# Patient Record
Sex: Male | Born: 1955 | Race: Black or African American | Hispanic: No
Health system: Southern US, Community
[De-identification: ages and names within clinical notes are randomized; demographics above are authoritative.]

---

## 2021-11-20 ENCOUNTER — Emergency Department (HOSPITAL_COMMUNITY): Payer: Medicare Other

## 2021-11-20 ENCOUNTER — Encounter (HOSPITAL_COMMUNITY): Payer: Self-pay | Admitting: Internal Medicine

## 2021-11-20 ENCOUNTER — Inpatient Hospital Stay (HOSPITAL_COMMUNITY)
Admission: EM | Admit: 2021-11-20 | Discharge: 2021-11-23 | DRG: 565 | Disposition: A | Discharge status: Skilled Nursing Facility | Payer: Medicare Other | Attending: Internal Medicine | Admitting: Internal Medicine

## 2021-11-20 ENCOUNTER — Other Ambulatory Visit: Payer: Self-pay

## 2021-11-20 DIAGNOSIS — N1831 Chronic kidney disease, stage 3a: Secondary | ICD-10-CM | POA: Diagnosis present

## 2021-11-20 DIAGNOSIS — T796XXA Traumatic ischemia of muscle, initial encounter: Principal | ICD-10-CM | POA: Diagnosis present

## 2021-11-20 DIAGNOSIS — F10129 Alcohol abuse with intoxication, unspecified: Secondary | ICD-10-CM

## 2021-11-20 DIAGNOSIS — N179 Acute kidney failure, unspecified: Secondary | ICD-10-CM | POA: Diagnosis present

## 2021-11-20 DIAGNOSIS — Z7982 Long term (current) use of aspirin: Secondary | ICD-10-CM

## 2021-11-20 DIAGNOSIS — F10230 Alcohol dependence with withdrawal, uncomplicated: Secondary | ICD-10-CM | POA: Diagnosis not present

## 2021-11-20 DIAGNOSIS — I672 Cerebral atherosclerosis: Secondary | ICD-10-CM | POA: Diagnosis present

## 2021-11-20 DIAGNOSIS — F0390 Unspecified dementia without behavioral disturbance: Secondary | ICD-10-CM | POA: Diagnosis present

## 2021-11-20 DIAGNOSIS — I129 Hypertensive chronic kidney disease with stage 1 through stage 4 chronic kidney disease, or unspecified chronic kidney disease: Secondary | ICD-10-CM | POA: Diagnosis present

## 2021-11-20 DIAGNOSIS — R350 Frequency of micturition: Secondary | ICD-10-CM | POA: Diagnosis present

## 2021-11-20 DIAGNOSIS — E861 Hypovolemia: Secondary | ICD-10-CM | POA: Diagnosis present

## 2021-11-20 DIAGNOSIS — E872 Acidosis, unspecified: Secondary | ICD-10-CM | POA: Diagnosis present

## 2021-11-20 DIAGNOSIS — R29810 Facial weakness: Secondary | ICD-10-CM | POA: Diagnosis present

## 2021-11-20 DIAGNOSIS — F10229 Alcohol dependence with intoxication, unspecified: Secondary | ICD-10-CM | POA: Diagnosis present

## 2021-11-20 DIAGNOSIS — S12101A Unspecified nondisplaced fracture of second cervical vertebra, initial encounter for closed fracture: Secondary | ICD-10-CM

## 2021-11-20 DIAGNOSIS — I4891 Unspecified atrial fibrillation: Secondary | ICD-10-CM | POA: Diagnosis not present

## 2021-11-20 DIAGNOSIS — F1410 Cocaine abuse, uncomplicated: Secondary | ICD-10-CM | POA: Diagnosis not present

## 2021-11-20 DIAGNOSIS — R471 Dysarthria and anarthria: Secondary | ICD-10-CM | POA: Diagnosis present

## 2021-11-20 DIAGNOSIS — Z20822 Contact with and (suspected) exposure to covid-19: Secondary | ICD-10-CM | POA: Diagnosis present

## 2021-11-20 DIAGNOSIS — Z79899 Other long term (current) drug therapy: Secondary | ICD-10-CM

## 2021-11-20 DIAGNOSIS — R55 Syncope and collapse: Secondary | ICD-10-CM | POA: Diagnosis not present

## 2021-11-20 DIAGNOSIS — R7303 Prediabetes: Secondary | ICD-10-CM | POA: Diagnosis present

## 2021-11-20 DIAGNOSIS — G40909 Epilepsy, unspecified, not intractable, without status epilepticus: Secondary | ICD-10-CM | POA: Diagnosis present

## 2021-11-20 DIAGNOSIS — E871 Hypo-osmolality and hyponatremia: Secondary | ICD-10-CM | POA: Diagnosis present

## 2021-11-20 DIAGNOSIS — Y906 Blood alcohol level of 120-199 mg/100 ml: Secondary | ICD-10-CM | POA: Diagnosis present

## 2021-11-20 DIAGNOSIS — N401 Enlarged prostate with lower urinary tract symptoms: Secondary | ICD-10-CM | POA: Diagnosis present

## 2021-11-20 LAB — DIFFERENTIAL
Abs Immature Granulocytes: 0.02 10*3/uL (ref 0.00–0.07)
Basophils Absolute: 0 10*3/uL (ref 0.0–0.1)
Basophils Relative: 1 %
Eosinophils Absolute: 0 10*3/uL (ref 0.0–0.5)
Eosinophils Relative: 0 %
Immature Granulocytes: 0 %
Lymphocytes Relative: 37 %
Lymphs Abs: 2 10*3/uL (ref 0.7–4.0)
Monocytes Absolute: 0.6 10*3/uL (ref 0.1–1.0)
Monocytes Relative: 11 %
Neutro Abs: 2.8 10*3/uL (ref 1.7–7.7)
Neutrophils Relative %: 51 %

## 2021-11-20 LAB — URINALYSIS, ROUTINE W REFLEX MICROSCOPIC
Bacteria, UA: NONE SEEN
Bilirubin Urine: NEGATIVE
Glucose, UA: NEGATIVE mg/dL
Ketones, ur: NEGATIVE mg/dL
Nitrite: NEGATIVE
Protein, ur: NEGATIVE mg/dL
Specific Gravity, Urine: 1.02 (ref 1.005–1.030)
pH: 5 (ref 5.0–8.0)

## 2021-11-20 LAB — COMPREHENSIVE METABOLIC PANEL
ALT: 37 U/L (ref 0–44)
AST: 83 U/L — ABNORMAL HIGH (ref 15–41)
Albumin: 3.7 g/dL (ref 3.5–5.0)
Alkaline Phosphatase: 46 U/L (ref 38–126)
Anion gap: 11 (ref 5–15)
BUN: 19 mg/dL (ref 8–23)
CO2: 18 mmol/L — ABNORMAL LOW (ref 22–32)
Calcium: 8.6 mg/dL — ABNORMAL LOW (ref 8.9–10.3)
Chloride: 101 mmol/L (ref 98–111)
Creatinine, Ser: 2.2 mg/dL — ABNORMAL HIGH (ref 0.61–1.24)
GFR, Estimated: 32 mL/min — ABNORMAL LOW (ref >60)
Glucose, Bld: 95 mg/dL (ref 70–99)
Potassium: 4 mmol/L (ref 3.5–5.1)
Sodium: 130 mmol/L — ABNORMAL LOW (ref 135–145)
Total Bilirubin: 0.8 mg/dL (ref 0.3–1.2)
Total Protein: 6.8 g/dL (ref 6.5–8.1)

## 2021-11-20 LAB — BETA-HYDROXYBUTYRIC ACID: Beta-Hydroxybutyric Acid: 0.33 mmol/L — ABNORMAL HIGH (ref 0.05–0.27)

## 2021-11-20 LAB — RESP PANEL BY RT-PCR (FLU A&B, COVID) ARPGX2
Influenza A by PCR: NEGATIVE
Influenza B by PCR: NEGATIVE
SARS Coronavirus 2 by RT PCR: NEGATIVE

## 2021-11-20 LAB — RAPID URINE DRUG SCREEN, HOSP PERFORMED
Amphetamines: NOT DETECTED
Barbiturates: NOT DETECTED
Benzodiazepines: NOT DETECTED
Cocaine: POSITIVE — AB
Opiates: NOT DETECTED
Tetrahydrocannabinol: NOT DETECTED

## 2021-11-20 LAB — CBC
HCT: 33.9 % — ABNORMAL LOW (ref 39.0–52.0)
Hemoglobin: 11.5 g/dL — ABNORMAL LOW (ref 13.0–17.0)
MCH: 32.6 pg (ref 26.0–34.0)
MCHC: 33.9 g/dL (ref 30.0–36.0)
MCV: 96 fL (ref 80.0–100.0)
Platelets: 201 10*3/uL (ref 150–400)
RBC: 3.53 MIL/uL — ABNORMAL LOW (ref 4.22–5.81)
RDW: 13.8 % (ref 11.5–15.5)
WBC: 5.5 10*3/uL (ref 4.0–10.5)
nRBC: 0 % (ref 0.0–0.2)

## 2021-11-20 LAB — APTT: aPTT: 33 seconds (ref 24–36)

## 2021-11-20 LAB — I-STAT CHEM 8, ED
BUN: 22 mg/dL (ref 8–23)
Calcium, Ion: 1.1 mmol/L — ABNORMAL LOW (ref 1.15–1.40)
Chloride: 100 mmol/L (ref 98–111)
Creatinine, Ser: 2.6 mg/dL — ABNORMAL HIGH (ref 0.61–1.24)
Glucose, Bld: 92 mg/dL (ref 70–99)
HCT: 37 % — ABNORMAL LOW (ref 39.0–52.0)
Hemoglobin: 12.6 g/dL — ABNORMAL LOW (ref 13.0–17.0)
Potassium: 4.1 mmol/L (ref 3.5–5.1)
Sodium: 131 mmol/L — ABNORMAL LOW (ref 135–145)
TCO2: 20 mmol/L — ABNORMAL LOW (ref 22–32)

## 2021-11-20 LAB — CK: Total CK: 1915 U/L — ABNORMAL HIGH (ref 49–397)

## 2021-11-20 LAB — PROTIME-INR
INR: 1.1 (ref 0.8–1.2)
Prothrombin Time: 13.7 seconds (ref 11.4–15.2)

## 2021-11-20 LAB — CBG MONITORING, ED: Glucose-Capillary: 111 mg/dL — ABNORMAL HIGH (ref 70–99)

## 2021-11-20 LAB — HEMOGLOBIN A1C
Hgb A1c MFr Bld: 6.1 % — ABNORMAL HIGH (ref 4.8–5.6)
Mean Plasma Glucose: 128.37 mg/dL

## 2021-11-20 LAB — HIV ANTIBODY (ROUTINE TESTING W REFLEX): HIV Screen 4th Generation wRfx: NONREACTIVE

## 2021-11-20 LAB — MAGNESIUM: Magnesium: 1.9 mg/dL (ref 1.7–2.4)

## 2021-11-20 LAB — ETHANOL: Alcohol, Ethyl (B): 155 mg/dL — ABNORMAL HIGH (ref <10)

## 2021-11-20 LAB — PHOSPHORUS: Phosphorus: 3.5 mg/dL (ref 2.5–4.6)

## 2021-11-20 MED ORDER — LORAZEPAM 2 MG/ML IJ SOLN: 1.0000 mg | INTRAMUSCULAR | Status: AC | PRN

## 2021-11-20 MED ORDER — HYDROCODONE-ACETAMINOPHEN 5-325 MG PO TABS
1.0000 | ORAL_TABLET | ORAL | Status: DC | PRN
  Administered 2021-11-20 – 2021-11-21 (×3): 1 via ORAL
  Administered 2021-11-21: 2 via ORAL
  Administered 2021-11-21: 1 via ORAL
  Administered 2021-11-21 – 2021-11-22 (×4): 2 via ORAL
  Administered 2021-11-22: 1 via ORAL
  Administered 2021-11-22 – 2021-11-23 (×5): 2 via ORAL
  Filled 2021-11-20 (×3): qty 2
  Filled 2021-11-20 (×2): qty 1
  Filled 2021-11-20: qty 2
  Filled 2021-11-20: qty 1
  Filled 2021-11-20: qty 2
  Filled 2021-11-20: qty 1
  Filled 2021-11-20 (×3): qty 2
  Filled 2021-11-20: qty 1
  Filled 2021-11-20 (×2): qty 2

## 2021-11-20 MED ORDER — SENNOSIDES-DOCUSATE SODIUM 8.6-50 MG PO TABS: 1.0000 | ORAL_TABLET | Freq: Every evening | ORAL | Status: DC | PRN

## 2021-11-20 MED ORDER — FOLIC ACID 1 MG PO TABS
1.0000 mg | ORAL_TABLET | Freq: Every day | ORAL | Status: DC
  Administered 2021-11-20 – 2021-11-23 (×4): 1 mg via ORAL
  Filled 2021-11-20 (×4): qty 1

## 2021-11-20 MED ORDER — ACETAMINOPHEN 650 MG RE SUPP: 650.0000 mg | Freq: Four times a day (QID) | RECTAL | Status: DC | PRN

## 2021-11-20 MED ORDER — LORAZEPAM 1 MG PO TABS
1.0000 mg | ORAL_TABLET | ORAL | Status: AC | PRN
  Administered 2021-11-21 – 2021-11-22 (×2): 1 mg via ORAL
  Filled 2021-11-20 (×3): qty 1

## 2021-11-20 MED ORDER — ACETAMINOPHEN 325 MG PO TABS: 650.0000 mg | ORAL_TABLET | Freq: Four times a day (QID) | ORAL | Status: DC | PRN

## 2021-11-20 MED ORDER — ENOXAPARIN SODIUM 30 MG/0.3ML IJ SOSY
30.0000 mg | PREFILLED_SYRINGE | INTRAMUSCULAR | Status: DC
  Administered 2021-11-20 – 2021-11-21 (×2): 30 mg via SUBCUTANEOUS
  Filled 2021-11-20 (×2): qty 0.3

## 2021-11-20 MED ORDER — LACTATED RINGERS IV SOLN: INTRAVENOUS | Status: AC

## 2021-11-20 MED ORDER — ADULT MULTIVITAMIN W/MINERALS CH
1.0000 | ORAL_TABLET | Freq: Every day | ORAL | Status: DC
  Administered 2021-11-20 – 2021-11-23 (×4): 1 via ORAL
  Filled 2021-11-20 (×4): qty 1

## 2021-11-20 MED ORDER — LACTATED RINGERS IV BOLUS
1000.0000 mL | Freq: Once | INTRAVENOUS | Status: AC
  Administered 2021-11-20: 1000 mL via INTRAVENOUS

## 2021-11-20 MED ORDER — IOHEXOL 350 MG/ML SOLN
75.0000 mL | Freq: Once | INTRAVENOUS | Status: AC | PRN
  Administered 2021-11-20: 75 mL via INTRAVENOUS

## 2021-11-20 MED ORDER — THIAMINE HCL 100 MG PO TABS
100.0000 mg | ORAL_TABLET | Freq: Every day | ORAL | Status: DC
  Administered 2021-11-20 – 2021-11-23 (×4): 100 mg via ORAL
  Filled 2021-11-20 (×4): qty 1

## 2021-11-20 MED ORDER — ASPIRIN 300 MG RE SUPP
300.0000 mg | Freq: Once | RECTAL | Status: AC
Start: 2021-11-20 — End: 2021-11-20
  Administered 2021-11-20: 300 mg via RECTAL
  Filled 2021-11-20: qty 1

## 2021-11-20 NOTE — ED Triage Notes (Signed)
Pt arrives via EMS from the store as code stroke with LSN 11. Pt went to the store and had a fall at the store. Pt was helped up and went to buy his beer. Pt was on bench slumped over when EMS arrived. EMS noted left side weakness, slurred speech and facial droop. C-collar in place.  ?Pt states he takes meds for dizziness. Denies blood thinners but also unsure what medications he takes.  ?

## 2021-11-20 NOTE — Hospital Course (Addendum)
Mr. Jake Powell is a 66 year old male with a past medical history significant for hypertension and dizziness.  ? ? ?Patient states that he was walking to the store, which she does every day without any issues.  As he was walking he began feeling lightheaded and he sat down.  His lightheadedness persisted despite sitting down.  He noted no prodromal symptoms including no palpitations, diaphoresis, feelings of warmness or coolness, nausea, or changes in his vision prior to experiencing a syncopal event. The event was witnessed. Patient had no confusion after his fall, no tongue biting, no loss of bowel or bladder function. He also denies orthostasis.  ? ?He reports that he first started having "black out spells" as a child, he does not remember when exactly they started but notes they last happened in his youth when he was 66 years old. He states they stopped happening for several years.   ? ?However approximately 1 to 2 years ago he began having these black out spells again.  He states during this time.  He had approximately 4 episodes.  The most recent episode was September 2022 during which he was hospitalized at [facility].  He states that episode was similar to his current presentation.   ? ?He does note that he has been on meclizine for these issues but does not recall having a specific diagnosis.  He has also been told that he had a seizure.  The patient reports he was never started on any antiepileptic medications.   ? ?Of note, patient reports that he drinks approximately 2-3 beers nightly and used to be a heavy hard liquor drinker from his teens up until 25 years ago. He also stt liquor past two years supposed to be taking medicine for dizzy spells. He also states that he takes benadryl daily for itching, though he is not clear as to what causes his itching.  ? ?Lastly patient reports urinary frequency over the past several years. He has spoken to doctors about this but has not been started on any  medications.  ? ?PMH:  ?Does not follow regularly with a PCP ?He last saw a Dr. April in [place] ~ 2 months ago. ?Most of his medications were written when he was hospitalized per patient.  ?He does not recall his medical diagnoses or his medications.  ?Listed in the chart ?Amlodipine 5 mg daily ?Lisinopril 10 mg daily ?BuSpar 5 mg twice daily ?Pepcid 20 mg daily ?Propranolol 20 mg nightly ?Thiamine 100 mg daily ?Zanaflex 4 mg every 6 hours as needed ?States he used to be on a blood thinner but it was stopped years ago, he is not certain why this was started ? ?PSH:  ?None  ? ?Allergies:  ?NKDA  ? ?FH: Mom died of natural causes, no known health problems  ?Dad deceased when patient was a child ? ?SH:  ?Never smoker ?Started drinking 50 years ago drinking liquour "all day and night." 25 y ago quit hard alcohol. Drinks 2-3 beers a day. Soemtimes none. His last drink was the night prior to the day of admission.  ?He does report cocaine use, he most recently used this 2days ago.  ?Patient is from [place] and moved to [place] to be with his spouse he passed away ~1 year ago. He currently lives alone, he is able to perform his ADLs/IADLs without difficulty.  ? ?Ran out of these pills about a month ago.  ? ?Pills for dizziness [facility] has all of records.  ? ?  Dr. April [place]  saw her 2 months ago.  ? ? ?You were walking to the store. Walks there everyday with no problem.  ? ?Sat down and started feeling dizzy. Lightheaded. Sditting down and walking.  ? ? ?Bystander saw patient fall. No palpitations. Sometimes get dizzy standing.  ? ? ?Used to have spells wwhen a kid where he would pass out.  ? ? ?Never diagnosed with anything.  ? ?A year or two years ago. Black out spells four times.  ? ?One doctor said it was seizure.  ? ? This was in 03/2021 [facility].  ? ?No peeeing or soiling, no bitingj tongue. Told fell on side.  ? ?N iochest pain.No shortness of breath. NO changes in vison.  ? ?Walks to the store  without any difficulty. Used to use a cane.  ?Didn't eat anything this morning. Usually eat something. Yesterday drinking alcohol drink 2-3 beer a day.  ? ?Bladder weka? Have to pee 5-6 times a night.  ? ? ?PMH: ?HTN ?Take benadryl mostly everyday. Ran out 3 d ago. Take asprin eeryday started 6 months ago.  ?No MI o CVA  ?BPH  ? ? ?PSH:  ? ?None ? ?Allergies: Sesonal allergies NKDA ? ?ON a blood thinner years ago. Told blood too thick years ? ?FH: Mom died of natural causes, no known health problems  ?Dad deceased when patient was a child ? ?SH:  ?Never smoker ?Started drinking 50 years ago drinking liquour "all day and night." 25 y ago quit hard alcohol. Drinks 2-3 beers a day. Soemtimes none. His last drink was the night prior to the day of admission.  ?He does report cocaine use, he most recently used this 2days ago.  ?Patient is from [place] and moved to [place] to be with his spouse he passed away ~1 year ago. He currently lives alone, he is able to perform his ADLs/IADLs without difficulty.  ? ?Last drink was yesterday evening.  ? ? ? ?Cocaine in the past, 2 days ago did cocaine.  ? ? ?Lately drink a lot of water. Cup in the morning  ? ? ? ?

## 2021-11-20 NOTE — H&P (Addendum)
? ? ? ?Date: 11/20/2021     ?     ?     ?Patient Name:  Jake Powell MRN: [id number]  ?DOB: 31-Mar-1956 Age / Sex: 66 y.o., male   ?PCP: Pcp, No    ?     ?Medical Service: Internal Medicine Teaching Service    ?     ?Attending Physician: Dr. Heber [place], Rachel Moulds, DO    ?First Contact: Dr. Emeline General, MD  Pager: [phone]  ?Second Contact: Dr. Eulas Post, MD  Pager: [phone]  ?     ?After Hours (After 5p/  First Contact Pager: [phone]  ?weekends / holidays): Second Contact Pager: [phone]  ? ?Chief Complaint: Syncope ? ?History of Present Illness:  ? ?Jake Powell is a 66 year old male with a past medical history significant for hypertension and dizziness.  ? ? ?Patient states that he was walking to the store, which she does every day without any issues.  As he was walking he began feeling lightheaded and he sat down.  His lightheadedness persisted despite sitting down.  He noted no prodromal symptoms including no palpitations, diaphoresis, feelings of warmness or coolness, nausea, or changes in his vision prior to experiencing a syncopal event. The event was witnessed. Patient had no confusion after his fall, no tongue biting, no loss of bowel or bladder function. He also denies orthostasis.  ? ?He reports that he first started having "black out spells" as a child, he does not remember when exactly they started but notes they last happened in his youth when he was 66 years old. He states they stopped happening for several years.   ? ?However approximately 1 to 2 years ago he began having these black out spells again.  He states during this time.  He had approximately 4 episodes.  The most recent episode was September 2022 during which he was hospitalized at [facility].  He states that episode was similar to his current presentation.   ? ?He does note that he has been on meclizine for these issues but does not recall having a specific diagnosis.  He has also been told that he had a seizure.  The patient reports he was  never started on any antiepileptic medications.   ? ?Of note, patient reports that he drinks approximately 2-3 beers nightly and used to be a heavy hard liquor drinker from his teens up until 25 years ago. He also stt liquor past two years supposed to be taking medicine for dizzy spells. He also states that he takes benadryl daily for itching, though he is not clear as to what causes his itching.  ? ?Lastly patient reports urinary frequency over the past several years. He has spoken to doctors about this but has not been started on any medications.  ? ?PMH:  ?Does not follow regularly with a PCP ?He last saw a Dr. April in [place] ~ 2 months ago. ?Most of his medications were written when he was hospitalized per patient.  ?He does not recall his medical diagnoses or his medications.  ?Listed in the chart ?Amlodipine 5 mg daily ?Lisinopril 10 mg daily ?BuSpar 5 mg twice daily ?Pepcid 20 mg daily ?Propranolol 20 mg nightly ?Thiamine 100 mg daily ?Zanaflex 4 mg every 6 hours as needed ?ASA 81mg  qd (unclear reason why) ?States he used to be on a blood thinner but it was stopped years ago, he is not certain why this was started ?Ran out of all of his pills a month  ago   ? ?PSH:  ?None  ? ?Allergies:  ?NKDA  ? ?FH: Mom died of natural causes, no known health problems  ?Dad deceased when patient was a child ? ?SH:  ?Never smoker ?Started drinking 50 years ago drinking liquour "all day and night." 25 y ago quit hard alcohol. Drinks 2-3 beers a day. Soemtimes none. His last drink was the night prior to the day of admission.  ?He does report cocaine use, he most recently used this 2days ago.  ?Patient is from [place] and moved to [place] to be with his spouse he passed away ~1 year ago. He currently lives alone, he is able to perform his ADLs/IADLs without difficulty.  ? ?Meds:  ?He has not had any of his medications  ?Current Meds  ?Medication Sig  ? acetaminophen (TYLENOL) 325 MG tablet Take 650 mg by mouth every 6 (six) hours as  needed for pain.  ? amLODipine (NORVASC) 5 MG tablet Take 1 tablet by mouth daily.  ? aspirin 81 MG EC tablet Take 1 tablet by mouth daily.  ? busPIRone (BUSPAR) 5 MG tablet Take 5 mg by mouth 2 (two) times daily as needed for anxiety.  ? famotidine (PEPCID) 20 MG tablet Take 1 tablet by mouth daily.  ? lisinopril (ZESTRIL) 10 MG tablet Take 10 mg by mouth daily.  ? Meclizine HCl 25 MG CHEW Chew 1 tablet by mouth daily as needed.  ? Multiple Vitamin (QUINTABS) TABS Take 1 tablet by mouth daily.  ? omeprazole (PRILOSEC OTC) 20 MG tablet Take 20 mg by mouth daily.  ? propranolol (INDERAL) 20 MG tablet Take 20 mg by mouth in the morning and at bedtime.  ? thiamine 100 MG tablet Take 100 mg by mouth daily.  ? tiZANidine (ZANAFLEX) 4 MG tablet Take 4 mg by mouth every 6 (six) hours as needed.  ? ? ? ?Allergies: ?Allergies as of 11/20/2021  ? (Not on File)  ? ? ? ?Review of Systems: ?A complete ROS was negative except as per HPI.  ? ?Physical Exam: ?Blood pressure 126/79, pulse 99, temperature 98.4 ?F (36.9 ?C), temperature source Oral, resp. rate 14, height 6\' 2"  (1.88 m), weight 84.6 kg, SpO2 96 %. ? ?Constitutional: Well-developed, well-nourished, disheveled, and in no distress.  ?HENT:  ?Head: Normocephalic and atraumatic.  ?Eyes: EOM are normal.  ?Neck: In [place] j collar  ?Cardiovascular: Normal rate, regular rhythm, intact distal pulses. No gallop and no friction rub.  ?No murmur heard. No lower extremity edema  ?Pulmonary: Non labored breathing on room air, no wheezing or rales  ?Abdominal: Soft. Normal bowel sounds. Non distended and non tender ?Musculoskeletal: Normal range of motion.     ?   General: No tenderness or edema.  ?Neurological: Alert and oriented to person, place, and time. Non focal  ?Skin: Skin is warm and dry.  ? ?EKG: personally reviewed my interpretation is NSR ? ?CT Cervical Spine ?FINDINGS: ?Alignment: Normal. There is an acute fracture deformity involving C2 ?vertebral body. Fracture line  extends from the right lateral base of ?the dens and extends vertically into the C2 vertebral body, image ?21/8. A second fracture is identified through the left posterior ?elements of the C2 vertebral body, image 25/11. The fracture ?fragments appear minimally displaced. ?  ?Skull base and vertebrae: No additional fracture identified. ?  ?Soft tissues and spinal canal: No prevertebral fluid or swelling. No ?visible canal hematoma. ?  ?Disc levels: Disc space narrowing and endplate spurring is ?identified at the  C5-6 level. ?  ?Upper chest: Negative. ?  ?Other: None ?  ?IMPRESSION: ?1. Acute fracture deformity involving the C2 vertebral body. ?Fracture line extends from the right lateral base of the dens and ?extends vertically into the C2 vertebral body. ?2. A second fracture is identified through the left posterior ?elements of C2 vertebral body. ?3. Cervical spondylosis. ? ?CTA Head and neck  ?FINDINGS: ?CTA NECK FINDINGS ?  ?Aortic arch: The imaged aortic arch is normal. The origins of the ?major branch vessels are patent. The subclavian arteries are patent ?to the level imaged. ?  ?Right carotid system: Right common, internal, and external carotid ?arteries are patent, without hemodynamically significant stenosis or ?occlusion. There is no evidence of dissection. There is mild ?fusiform dilation of the distal internal carotid artery in the neck ?measuring up to 7 mm in the coronal plane (compared to a more ?proximal diameter of approximately 5 mm). ?  ?Left carotid system: The left common, internal, and external carotid ?arteries are patent, without hemodynamically significant stenosis or ?occlusion. There is no dissection or aneurysm. ?  ?Vertebral arteries: The vertebral arteries are patent, without ?hemodynamically significant stenosis or occlusion. There is no ?dissection or aneurysm. ?  ?Skeleton: There is an acute fracture of the C2 vertebral body with ?an oblique fracture plane extending from the right  aspect of the ?base of the dens inferiorly through the vertebral body. There is ?extension into the left lamina and inferior articulating process ?(7-177, 9-123). There is no other acute fracture. There i

## 2021-11-20 NOTE — Consult Note (Addendum)
Neurology Attending Attestation ?  ?I examined the patient and discussed plan with resident Dr. Leonides Cave. I agree with the note below with the following additions/exceptions: ? ?This is a 66 year old gentleman with a past medical history significant for alcohol use disorder, questionable seizure history, he was found outside on a bench slumped over on his left side after he walked to the store at 11 AM to purchase alcohol.  On arrival he had a questionable left facial droop, was able to name 5 out of 6 objects, and had mild dysarthria giving him a stroke scale of 3. Head CT NAICP personal review. TNK not administered 2/2 mild sx and low suspicion for stroke. Stroke code canceled. ? ?CT c spine showed C2 fracture. CTA H&N showed no hemodynamically significant stenosis  and a fusiform filation of the high cervical R ICA measuring up to 43mm. Utox (+) for cocaine. EtOH level 155. Labs c/w rhabdomyolysis with CK 1915, creatinine 2.60, urine dipstick (+) for Hgb. ? ?Patient presented with nonfocal sx except ?L facial droop in the setting of cocaine and EtOH intoxication, rhabdomyolysis, and fall vs syncope resulting in C2 fracture. Suspicion for stroke is low and no further stroke workup is recommended at this time. Patient is unsure if he has had seizures in the past, and was not on AED PTA, but is at risk for withdrawal seizures. Recommend CIWA, thiamine supplementation, and low threshold to reconsult neurology for seizure-like activity or further deterioration in mental status. Otherwise neurology will be available for questions prn going forward. ? ?I was present throughout the stroke code and made all significant decisions and personally reviewed CNS imaging ?  ?Su Monks, MD ?[facility] ?[phone] ?  ?If 7pm- 7am, please page neurology on call as listed in [place]. ? ? ?NEUROLOGY CONSULTATION NOTE  ? ?Date of service: Nov 20, 2021 ?Patient Name: Jake Powell ?MRN:  [id number] ?DOB:  11/24/1955 ?Reason  for consult: "code stroke" ?Requesting physician: EDP ?_ _ _   _ __   _ __ _ _  __ __   _ __   __ _ ? ?History of Present Illness  ? ?Jake Powell is a 66 y.o. male with PMH significant for ETOH use disorder, seizure disorder, who presents with  left-sided weakness and decreased sensation.  ? ?Patient last known normal this morning at 11 am when walking to the store to purchase ETOH. The patient was subsequently found outside in the grass slumped over on his left side. EMS was called and states that he had left upper and lower extremity weakness and anesthesia with associated dysarthria. Unknown last drink but typically drink 2x 40oz beers daily. Endorses a history of ETOH related seizures and not on AED. States that he was previously on [facility] but denies current [facility] and does not remember the names of his medications.  ? ?Stroke workup this admission: ? ?CTH: no obvious intercranial hemorrhage or subacute infarct. Significant left MCA calcification noted.  ? ?CTA/MRA: pending  ? ?Lipid Panel: No results found for: [place] ?HgbA1c: No results found for: HGBA1C ? ?  ?ROS  ? ?Per HPI; all other systems reviewed and are negative ? ?Past History  ? ?No past medical history on file. ? ?No family history on file. ?Social History  ? ?Socioeconomic History  ? Marital status: Not on file  ?  Spouse name: Not on file  ? Number of children: Not on file  ? Years of education: Not on file  ? Highest education level: Not  on file  ?Occupational History  ? Not on file  ?Tobacco Use  ? Smoking status: Not on file  ? Smokeless tobacco: Not on file  ?Substance and Sexual Activity  ? Alcohol use: Not on file  ? Drug use: Not on file  ? Sexual activity: Not on file  ?Other Topics Concern  ? Not on file  ?Social History Narrative  ? Not on file  ? ?Social Determinants of Health  ? ?Financial Resource Strain: Not on file  ?Food Insecurity: Not on file  ?Transportation Needs: Not on file  ?Physical Activity: Not on file  ?Stress: Not on file   ?Social Connections: Not on file  ? ?Not on File ? ?Medications  ? ?(Not in a hospital admission) ?  ? ?Vitals  ? ?Vitals:  ? 11/20/21 1200  ?Weight: 84.6 kg  ?  ? ?There is no height or weight on file to calculate BMI. ? ?Physical Exam  ? ?Physical Exam ?Gen: A&O x4, NAD ?Resp: CTAB ?CV: RRR ?Extrem: Nml bulk; no cyanosis, clubbing, or edema. ? ?Neuro: ?*MS: A&O x4. Follows multi-step commands.  ?*Speech: dysarthric and mild aphasia ?*CN:  ?  I: Deferred ?  II,III: PERRLA, VFF by confrontation, optic discs not visualized 2/2 pupillary construction ?  III,IV,VI: EOMI w/o nystagmus, no ptosis ?  V: Sensation intact from V1 to V3 to LT ?  VII: Eyelid closure was full. left facial droop. ?  VIII: Hearing intact to voice ?  IX,X: Voice normal, palate elevates symmetrically  ?  XI: SCM/trap 5/5 bilat   ?XII: Tongue protrudes midline, no atrophy or fasciculations  ? ?*Motor:   Normal bulk.  No tremor, rigidity or bradykinesia. No pronator drift. ?Able to lift upper and lower extremities against gravity without drift.  ? ?*Sensory: Intact to light touch, pinprick, temperature vibration throughout. Symmetric. Propioception intact bilat.  No double-simultaneous extinction.  ?*Coordination:  Finger-to-nose intact and heel to shine normal ?*Reflexes:  ?*Gait: ? ?NIHSS ? ?1a Level of Conscious.: 0 ?1b LOC Questions: 0 ?1c LOC Commands: 0 ?2 Best Gaze: 0 ?3 Visual: 0 ?4 Facial Palsy: 1 (left facial droop) ?5a Motor Arm - left: 0 ?5b Motor Arm - Right: 0 ?6a Motor Leg - Left: 0 ?6b Motor Leg - Right: 0 ?7 Limb Ataxia: 0 ?8 Sensory: 0 ?9 Best Language: 1 (aphasia) ?10 Dysarthria: 1 ?11 Extinct. and Inatten.: 0 ? ?TOTAL: 3 ? ? ?Premorbid mRS = 2+ ? ? ?Labs  ? ? ?CBC:  ?Recent Labs  ?Lab 11/20/21 ?1210  ?HGB 12.6*  ?HCT 37.0*  ? ? ?Basic Metabolic Panel:  ?Lab Results  ?Component Value Date  ? NA 131 (L) 11/20/2021  ? K 4.1 11/20/2021  ? GLUCOSE 92 11/20/2021  ? BUN 22 11/20/2021  ? CREATININE 2.60 (H) 11/20/2021  ? ? ?Urine Drug  Screen: No results found for: LABOPIA, COCAINSCRNUR, [place], [place], THCU, LABBARB  ?Alcohol Level No results found for: [place] ? ?CT Head without contrast: ?No obvious intercranial hemorrhage or subacute infarct. Significant left MCA calcification noted.  ? ?CT angio Head and Neck with contrast: pending ? ?rEEG: pending ? ?Impression  ? ?Cerebral Infarct: ?Patient presents with acute onset focal neurologic deficits. Code stoke was called. Patient was evaluated and found to have NIHSS of 3 and mR score of 2. Deemed not a candidate for TNK due to rapidly improving symptoms. CTA pending due to significant MCA calcification noted on CT head. Additionally patient has a history of ETOH withdrawal seizure and seizure  disorder and will need EEG once admitted. ? ?ETOH use disorder:  ?Patient has a history of ETOH used disorder and is high risk for associated withdrawal seizures. Patient does endorse an underlying seizure disorder but it is unclear if this is only associated with his ETOH use. Will need IP admission for withdrawal monitoring and EEG  ? ?Seizure Disorder: ?Unclear if this is associated with ETOH use or other substance use. Denies AED use.  ? ?Recommendations  ? ?See attending attestation ? ?______________________________________________________________________ ? ? ?Thank you for the opportunity to take part in the care of this patient. If you have any further questions, please contact the neurology consultation attending. ? ?Signed, ? ?Lawerance Cruel, D.O.  ?Internal Medicine Resident, PGY-3 ?[facility]  ?12:41 PM, 11/20/2021  ? ?

## 2021-11-20 NOTE — ED Notes (Signed)
Pt placed in aspen collar °

## 2021-11-20 NOTE — Code Documentation (Signed)
Stroke Response Nurse Documentation ?Code Documentation ? ?Jake Powell is a 66 y.o. male arriving to [facility]  via [facility] on 11/20/21 with past medical hx of ETOH. On No antithrombotic. Code stroke was activated by EMS.  ? ?Patient was LKW at 1100 when he was buying beer at the store. Patient fell and was helped up, bought beer and then was found outside slumped on the bench. EMS arrived and noted left facial droop, left weakness and slurred speech.  C-collar placed. ? ?Stroke team at the bedside on patient arrival. Labs drawn and patient cleared for CT by EDP.  Patient to CT with team. NIHSS 3, see documentation for details and code stroke times. Patient with left facial droop, Expressive aphasia , and dysarthria  on exam.  ? ?The following imaging was completed:  CT Head and CTA. Patient is not a candidate for IV Thrombolytic due to symptoms mild, improving. Patient is not a candidate for IR due to no LVO.  ? ?Care Plan: q2h x12 hoursVS and mNIHSS.  ? ?Bedside handoff with ED RN Burman Nieves.   ? ?Newman Nickels  ?Stroke Response RN ? ? ?

## 2021-11-20 NOTE — Progress Notes (Signed)
?  NEUROSURGERY PROGRESS NOTE  ? ?Called by ED MD after pt presented to ER intoxicated and s/p fall from possible syncopal episode. Per report with non-focal exam. CT C-spine personally reviewed and demonstrates non-displaced oblique fracture through C2 body and a unilateral left C2 pedicle fracture. No spondylolisthesis. This does not appear to be an unstable fracture, can manage conservatively with rigid collar and f/u in outpatient NS clinic in 3 weeks. ? ? ?Consuella Lose, MD ?[facility]  ? ?

## 2021-11-21 ENCOUNTER — Encounter (HOSPITAL_COMMUNITY): Payer: Self-pay | Admitting: Internal Medicine

## 2021-11-21 DIAGNOSIS — N179 Acute kidney failure, unspecified: Secondary | ICD-10-CM

## 2021-11-21 DIAGNOSIS — R55 Syncope and collapse: Secondary | ICD-10-CM | POA: Diagnosis not present

## 2021-11-21 DIAGNOSIS — F1090 Alcohol use, unspecified, uncomplicated: Secondary | ICD-10-CM | POA: Diagnosis not present

## 2021-11-21 LAB — VITAMIN B12: Vitamin B-12: 548 pg/mL (ref 180–914)

## 2021-11-21 LAB — COMPREHENSIVE METABOLIC PANEL
ALT: 32 U/L (ref 0–44)
AST: 61 U/L — ABNORMAL HIGH (ref 15–41)
Albumin: 3.6 g/dL (ref 3.5–5.0)
Alkaline Phosphatase: 48 U/L (ref 38–126)
Anion gap: 7 (ref 5–15)
BUN: 22 mg/dL (ref 8–23)
CO2: 22 mmol/L (ref 22–32)
Calcium: 9.3 mg/dL (ref 8.9–10.3)
Chloride: 102 mmol/L (ref 98–111)
Creatinine, Ser: 1.65 mg/dL — ABNORMAL HIGH (ref 0.61–1.24)
GFR, Estimated: 45 mL/min — ABNORMAL LOW (ref >60)
Glucose, Bld: 100 mg/dL — ABNORMAL HIGH (ref 70–99)
Potassium: 4.6 mmol/L (ref 3.5–5.1)
Sodium: 131 mmol/L — ABNORMAL LOW (ref 135–145)
Total Bilirubin: 1.1 mg/dL (ref 0.3–1.2)
Total Protein: 6.8 g/dL (ref 6.5–8.1)

## 2021-11-21 LAB — CBC
HCT: 34.3 % — ABNORMAL LOW (ref 39.0–52.0)
Hemoglobin: 11.3 g/dL — ABNORMAL LOW (ref 13.0–17.0)
MCH: 31.3 pg (ref 26.0–34.0)
MCHC: 32.9 g/dL (ref 30.0–36.0)
MCV: 95 fL (ref 80.0–100.0)
Platelets: 204 10*3/uL (ref 150–400)
RBC: 3.61 MIL/uL — ABNORMAL LOW (ref 4.22–5.81)
RDW: 13.7 % (ref 11.5–15.5)
WBC: 7 10*3/uL (ref 4.0–10.5)
nRBC: 0 % (ref 0.0–0.2)

## 2021-11-21 LAB — FOLATE: Folate: 16.2 ng/mL (ref >5.9)

## 2021-11-21 LAB — CK: Total CK: 1161 U/L — ABNORMAL HIGH (ref 49–397)

## 2021-11-21 MED ORDER — HYDROXYZINE HCL 25 MG PO TABS
25.0000 mg | ORAL_TABLET | Freq: Two times a day (BID) | ORAL | Status: DC | PRN
  Administered 2021-11-21 – 2021-11-23 (×5): 25 mg via ORAL
  Filled 2021-11-21 (×5): qty 1

## 2021-11-21 NOTE — Evaluation (Signed)
Occupational Therapy Evaluation ?Patient Details ?Name: Jake Powell ?MRN: [id number] ?DOB: 09-23-1955 ?Today's Date: 11/21/2021 ? ? ?History of Present Illness Pt is a 66 year old man who presented on 11/20/21 after syncopal episode outside of a store. CT + C2 non displaced vertebral body fx. Pt with AKI, hyponatremia, elevated transaminase level. Head CT negative. PMH: HTN, dizziness, cocaine and alcohol abuse, BPH.  ? ?Clinical Impression ?  ?Pt was functioning independently prior to admission. He was sponge bathing as his tub is not useable. Pt presents with significant neck pain, generalized weakness and impaired standing balance. Pt on CIWA precautions. He was a difficult historian and difficult to understand at times. Pt requires 2 person assist for all mobility and min to total assist for ADLs. Began educating pt in cervical precautions. Pt does not have support at home. Recommending SNF for further rehab.  ?   ? ?Recommendations for follow up therapy are one component of a multi-disciplinary discharge planning process, led by the attending physician.  Recommendations may be updated based on patient status, additional functional criteria and insurance authorization.  ? ?Follow Up Recommendations ? Skilled nursing-short term rehab (<3 hours/day)  ?  ?Assistance Recommended at Discharge Frequent or constant Supervision/Assistance  ?Patient can return home with the following A lot of help with bathing/dressing/bathroom;Two people to help with walking and/or transfers;Assistance with feeding;Assist for transportation;Help with stairs or ramp for entrance ? ?  ?Functional Status Assessment ? Patient has had a recent decline in their functional status and demonstrates the ability to make significant improvements in function in a reasonable and predictable amount of time.  ?Equipment Recommendations ? Other (comment) (defer to next venue of care)  ?  ?Recommendations for Other Services   ? ? ?  ?Precautions /  Restrictions Precautions ?Precautions: Cervical;Fall ?Precaution Booklet Issued: No ?Precaution Comments: verbally educated in cervical precautions ?Required Braces or Orthoses: Cervical Brace ?Cervical Brace: Hard collar;At all times  ? ?  ? ?Mobility Bed Mobility ?Overal bed mobility: Needs Assistance ?Bed Mobility: Sit to Supine, Supine to Sit ?  ?  ?Supine to sit: +2 for physical assistance, Max assist ?Sit to supine: +2 for physical assistance, Mod assist ?  ?General bed mobility comments: assist for LEs over EOB, to raise trunk and position hips at EOB, guided trunk with assist for LEs back into bed, pt with increased pain in neck with mobility ?  ? ?Transfers ?Overall transfer level: Needs assistance ?Equipment used: 2 person hand held assist ?Transfers: Sit to/from Stand ?Sit to Stand: +2 physical assistance, Mod assist ?  ?  ?  ?  ?  ?General transfer comment: assist to rise and steady, pt able to pivot feet toward R, but did not take a true step at EOB ?  ? ?  ?Balance Overall balance assessment: Needs assistance ?Sitting-balance support: Bilateral upper extremity supported ?Sitting balance-Leahy Scale: Fair ?  ?  ?Standing balance support: Bilateral upper extremity supported ?Standing balance-Leahy Scale: Poor ?  ?  ?  ?  ?  ?  ?  ?  ?  ?  ?  ?  ?   ? ?ADL either performed or assessed with clinical judgement  ? ?ADL Overall ADL's : Needs assistance/impaired ?Eating/Feeding: Minimal assistance;Sitting;Bed level ?  ?Grooming: Sitting;Minimal assistance;Bed level ?  ?Upper Body Bathing: Moderate assistance;Sitting ?  ?Lower Body Bathing: Total assistance;+2 for physical assistance;Sit to/from stand ?  ?Upper Body Dressing : Moderate assistance;Sitting ?  ?Lower Body Dressing: Total assistance;Bed level ?  ?  ?  ?  Toileting- Clothing Manipulation and Hygiene: Total assistance;+2 for physical assistance;Sit to/from stand ?  ?  ?  ?Functional mobility during ADLs: +2 for physical assistance;Moderate assistance ?    ? ? ? ?Vision Ability to See in Adequate Light: 0 Adequate ?Patient Visual Report: No change from baseline ?Additional Comments: reports his glasses were thrown away  ?   ?Perception   ?  ?Praxis   ?  ? ?Pertinent Vitals/Pain Pain Assessment ?Pain Assessment: Faces ?Faces Pain Scale: Hurts whole lot ?Pain Location: neck ?Pain Descriptors / Indicators: Moaning, Grimacing, Guarding, Discomfort ?Pain Intervention(s): Repositioned, Monitored during session, Limited activity within patient's tolerance  ? ? ? ?Hand Dominance Right ?  ?Extremity/Trunk Assessment Upper Extremity Assessment ?Upper Extremity Assessment: Generalized weakness (no formally assessed due to cervical precautions, able to reach top of his head, denies numbness or tingling) ?  ?Lower Extremity Assessment ?Lower Extremity Assessment: Defer to PT evaluation ?  ?Cervical / Trunk Assessment ?Cervical / Trunk Assessment: Other exceptions ?Cervical / Trunk Exceptions: cervical fx in [place] J collar ?  ?Communication Communication ?Communication: Expressive difficulties (difficult to understand at times) ?  ?Cognition Arousal/Alertness: Awake/alert ?Behavior During Therapy: Flat affect ?Overall Cognitive Status: No family/caregiver present to determine baseline cognitive functioning ?  ?  ?  ?  ?  ?  ?  ?  ?  ?  ?  ?  ?  ?  ?  ?  ?General Comments: pt is a difficult historian, pt minimizing his ability to mobilize, self feed, per RN pt receiving medication for alcohol withdrawal ?  ?  ?General Comments    ? ?  ?Exercises   ?  ?Shoulder Instructions    ? ? ?Home Living Family/patient expects to be discharged to:: Private residence ?Living Arrangements: Alone ?Available Help at Discharge: Family;Available PRN/intermittently (cousin check on him weekly) ?Type of Home: House (duplex) ?Home Access: Stairs to enter ?Entrance Stairs-Number of Steps: 1 ?  ?Home Layout: One level ?  ?  ?Bathroom Shower/Tub: Other (comment) (tub not usable) ?  ?Bathroom Toilet:  Handicapped height ?  ?  ?Home Equipment: Gilmer Mor - single point ?  ?  ?  ? ?  ?Prior Functioning/Environment Prior Level of Function : Independent/Modified Independent ?  ?  ?  ?  ?  ?  ?Mobility Comments: stopped using cane about a month ago ?ADLs Comments: sponge bathes ?  ? ?  ?  ?OT Problem List: Decreased strength;Impaired balance (sitting and/or standing);Decreased cognition;Decreased knowledge of use of DME or AE;Pain ?  ?   ?OT Treatment/Interventions: Self-care/ADL training;DME and/or AE instruction;Therapeutic activities;Patient/family education;Balance training;Cognitive remediation/compensation  ?  ?OT Goals(Current goals can be found in the care plan section) Acute Rehab OT Goals ?OT Goal Formulation: With patient ?Time For Goal Achievement: 12/05/21 ?Potential to Achieve Goals: Good  ?OT Frequency: Min 2X/week ?  ? ?Co-evaluation PT/OT/SLP Co-Evaluation/Treatment: Yes ?Reason for Co-Treatment: For patient/therapist safety ?  ?OT goals addressed during session: ADL's and self-care ?  ? ?  ?AM-PAC OT "6 Clicks" Daily Activity     ?Outcome Measure Help from another person eating meals?: A Little ?Help from another person taking care of personal grooming?: A Little ?Help from another person toileting, which includes using toliet, bedpan, or urinal?: Total ?Help from another person bathing (including washing, rinsing, drying)?: A Lot ?Help from another person to put on and taking off regular upper body clothing?: A Lot ?Help from another person to put on and taking off regular lower body clothing?: Total ?6  Click Score: 12 ?  ?End of Session Equipment Utilized During Treatment: Gait belt ?Nurse Communication: Mobility status ? ?Activity Tolerance: Patient tolerated treatment well ?Patient left: in chair;with call bell/phone within reach ? ?OT Visit Diagnosis: Unsteadiness on feet (R26.81);Pain;Muscle weakness (generalized) (M62.81)  ?              ?Time: [id number] ?OT Time Calculation (min): 23 min ?Charges:   OT General Charges ?$OT Visit: 1 Visit ?OT Evaluation ?$OT Eval Moderate Complexity: 1 Mod ?Martie RoundJulie Mirha Brucato, OTR/L ?Acute Rehabilitation Services ?Pager: [phone] ?Office: [phone]  ?Evern Bio[place], Ariely [place] ?

## 2021-11-21 NOTE — ED Notes (Signed)
Admit MD at bedside

## 2021-11-21 NOTE — ED Notes (Signed)
Breakfast orders placed 

## 2021-11-21 NOTE — ED Provider Notes (Signed)
?[facility] ?Provider Note ? ? ?CSN: [id number] ?Arrival date & time: 11/20/21  1159 ? ?  ? ?History ? ?Chief Complaint  ?Patient presents with  ? Code Stroke  ? ? ?Dellon Revette is a 66 y.o. male. ? ?HPI ? ?  ? ?66 year old male comes in with chief complaint of syncopal episode, slurred speech.  Code stroke was activated on the field. ? ?Patient has history of alcoholism, CKD, questionable seizure disorder.  He indicates that he had gone to the store like he normally does to purchase some alcohol.  He sat on the bench to drink alcohol and then the next he recalls he had passed out and waking up from the floor.  Bystanders noted that patient was having slurred speech and paramedics were called.  Patient indicates that he had no warning before he passed out.  Specifically, he had no chest pain, palpitations, shortness of breath, dizziness or sweating.  In the last 2 years he has had few episodes like this, the last one being probably in September.  He has no cardiac disease history. ? ?Code stroke was called as slurred speech was also noted.  Patient unclear if his speech is normal, he thinks it is most likely normal. ? ?Patient admits to cocaine use. ? ?Home Medications ?Prior to Admission medications   ?Medication Sig Start Date End Date Taking? Authorizing Provider  ?acetaminophen (TYLENOL) 325 MG tablet Take 650 mg by mouth every 6 (six) hours as needed for pain. 02/16/21  Yes [provider]  ?amLODipine (NORVASC) 5 MG tablet Take 1 tablet by mouth daily. 05/16/21  Yes [provider]  ?aspirin 81 MG EC tablet Take 1 tablet by mouth daily. 02/17/21  Yes [provider]  ?busPIRone (BUSPAR) 5 MG tablet Take 5 mg by mouth 2 (two) times daily as needed for anxiety. 01/24/21  Yes [provider]  ?famotidine (PEPCID) 20 MG tablet Take 1 tablet by mouth daily.   Yes [provider]  ?lisinopril (ZESTRIL) 10 MG tablet Take 10 mg by mouth daily.  05/16/21  Yes [provider]  ?Meclizine HCl 25 MG CHEW Chew 1 tablet by mouth daily as needed. 09/24/21  Yes [provider]  ?Multiple Vitamin (QUINTABS) TABS Take 1 tablet by mouth daily. 03/24/21  Yes [provider]  ?omeprazole (PRILOSEC OTC) 20 MG tablet Take 20 mg by mouth daily. 08/16/15  Yes [provider]  ?propranolol (INDERAL) 20 MG tablet Take 20 mg by mouth in the morning and at bedtime. 03/13/16  Yes [provider]  ?thiamine 100 MG tablet Take 100 mg by mouth daily. 03/24/21  Yes [provider]  ?tiZANidine (ZANAFLEX) 4 MG tablet Take 4 mg by mouth every 6 (six) hours as needed. 10/03/15  Yes [provider]  ?   ? ?Allergies    ?Patient has no allergy information on record.   ? ?Review of Systems   ?Review of Systems  ?All other systems reviewed and are negative. ? ?Physical Exam ?Updated Vital Signs ?BP (!) 145/96   Pulse 83   Temp 98.4 ?F (36.9 ?C) (Oral)   Resp 12   Ht 6\' 2"  (1.88 m)   Wt 84.6 kg   SpO2 94%   BMI 23.95 kg/m?  ?Physical Exam ?Vitals and nursing note reviewed.  ?Constitutional:   ?   Appearance: He is well-developed.  ?HENT:  ?   Head: Atraumatic.  ?Eyes:  ?   Extraocular Movements: Extraocular movements  intact.  ?   Pupils: Pupils are equal, round, and reactive to light.  ?Neck:  ?   Comments: In a c-collar ?Cardiovascular:  ?   Rate and Rhythm: Normal rate.  ?Pulmonary:  ?   Effort: Pulmonary effort is normal.  ?Skin: ?   General: Skin is warm.  ?Neurological:  ?   Mental Status: He is alert and oriented to person, place, and time.  ?   Cranial Nerves: No cranial nerve deficit.  ?   Sensory: No sensory deficit.  ?   Motor: No weakness.  ?   Coordination: Coordination normal.  ?   Comments: Dysarthria  ? ? ?ED Results / Procedures / Treatments   ?Labs ?(all labs ordered are listed, but only abnormal results are displayed) ?Labs Reviewed  ?ETHANOL - Abnormal; Notable for the following components:  ?    Result Value   ? Alcohol, Ethyl (B) 155 (*)   ? All other components within normal limits  ?CBC - Abnormal; Notable for the following components:  ? RBC 3.53 (*)   ? Hemoglobin 11.5 (*)   ? HCT 33.9 (*)   ? All other components within normal limits  ?COMPREHENSIVE METABOLIC PANEL - Abnormal; Notable for the following components:  ? Sodium 130 (*)   ? CO2 18 (*)   ? Creatinine, Ser 2.20 (*)   ? Calcium 8.6 (*)   ? AST 83 (*)   ? GFR, Estimated 32 (*)   ? All other components within normal limits  ?RAPID URINE DRUG SCREEN, HOSP PERFORMED - Abnormal; Notable for the following components:  ? Cocaine POSITIVE (*)   ? All other components within normal limits  ?URINALYSIS, ROUTINE W REFLEX MICROSCOPIC - Abnormal; Notable for the following components:  ? Hgb urine dipstick SMALL (*)   ? Leukocytes,Ua MODERATE (*)   ? All other components within normal limits  ?HEMOGLOBIN A1C - Abnormal; Notable for the following components:  ? Hgb A1c MFr Bld 6.1 (*)   ? All other components within normal limits  ?CK - Abnormal; Notable for the following components:  ? Total CK 1,915 (*)   ? All other components within normal limits  ?COMPREHENSIVE METABOLIC PANEL - Abnormal; Notable for the following components:  ? Sodium 131 (*)   ? Glucose, Bld 100 (*)   ? Creatinine, Ser 1.65 (*)   ? AST 61 (*)   ? GFR, Estimated 45 (*)   ? All other components within normal limits  ?CBC - Abnormal; Notable for the following components:  ? RBC 3.61 (*)   ? Hemoglobin 11.3 (*)   ? HCT 34.3 (*)   ? All other components within normal limits  ?BETA-HYDROXYBUTYRIC ACID - Abnormal; Notable for the following components:  ? Beta-Hydroxybutyric Acid 0.33 (*)   ? All other components within normal limits  ?CK - Abnormal; Notable for the following components:  ? Total CK 1,161 (*)   ? All other components within normal limits  ?I-STAT CHEM 8, ED - Abnormal; Notable for the following components:  ? Sodium 131 (*)   ? Creatinine, Ser 2.60 (*)   ? Calcium, Ion 1.10 (*)   ? TCO2  20 (*)   ? Hemoglobin 12.6 (*)   ? HCT 37.0 (*)   ? All other components within normal limits  ?CBG MONITORING, ED - Abnormal; Notable for the following components:  ? Glucose-Capillary 111 (*)   ? All other components within normal limits  ?RESP PANEL BY RT-PCR (FLU  A&B, COVID) ARPGX2  ?PROTIME-INR  ?APTT  ?DIFFERENTIAL  ?HIV ANTIBODY (ROUTINE TESTING W REFLEX)  ?MAGNESIUM  ?PHOSPHORUS  ?VITAMIN B12  ?FOLATE  ? ? ?EKG ?EKG Interpretation ? ?Date/Time:  Tuesday Nov 20 2021 12:32:17 EDT ?Ventricular Rate:  80 ?PR Interval:  170 ?QRS Duration: 85 ?QT Interval:  369 ?QTC Calculation: 426 ?R Axis:   70 ?Text Interpretation: Sinus rhythm Consider left atrial enlargement Abnormal R-wave progression, early transition No old tracing to compare Confirmed by Delora Fuel ([id number]) on 11/21/2021 6:56:27 AM ? ?Radiology ?CT CERVICAL SPINE WO CONTRAST ? ?Addendum Date: 11/20/2021   ?ADDENDUM REPORT: 11/20/2021 13:24 ADDENDUM: Critical Value/emergent results were called by telephone at the time of interpretation on 11/20/2021 at 1:24 pm to provider [facility] , who verbally acknowledged these results. Electronically Signed   By: Kerby Moors M.D.   On: 11/20/2021 13:24  ? ?Result Date: 11/20/2021 ?CLINICAL DATA:  Status post fall. EXAM: CT CERVICAL SPINE WITHOUT CONTRAST TECHNIQUE: Multidetector CT imaging of the cervical spine was performed without intravenous contrast. Multiplanar CT image reconstructions were also generated. RADIATION DOSE REDUCTION: This exam was performed according to the departmental dose-optimization program which includes automated exposure control, adjustment of the mA and/or kV according to patient size and/or use of iterative reconstruction technique. COMPARISON:  None Available. FINDINGS: Alignment: Normal. There is an acute fracture deformity involving C2 vertebral body. Fracture line extends from the right lateral base of the dens and extends vertically into the C2 vertebral body, image 21/8. A second  fracture is identified through the left posterior elements of the C2 vertebral body, image 25/11. The fracture fragments appear minimally displaced. Skull base and vertebrae: No additional fracture identified

## 2021-11-21 NOTE — ED Notes (Signed)
PT at bedside.

## 2021-11-21 NOTE — Evaluation (Signed)
Physical Therapy Evaluation ?Patient Details ?Name: Jake Powell ?MRN: [id number] ?DOB: 02-18-1956 ?Today's Date: 11/21/2021 ? ?History of Present Illness ? Pt is a 66 year old man who presented on 11/20/21 after syncopal episode outside of a store. CT + C2 non displaced vertebral body fx. Pt with AKI, hyponatremia, elevated transaminase level. Head CT negative. PMH: HTN, dizziness, cocaine and alcohol abuse, BPH.  ?Clinical Impression ? Pt admitted secondary to problem above with deficits below. Pt requiring mod-max A +2 for bed mobility and to stand at EOB. Performing pivotal movements towards HOB, but unable to take a true step. Feel pt is at increased risk for falls. Recommending SNF level therapies. Will continue to follow acutely.  ?   ? ?Recommendations for follow up therapy are one component of a multi-disciplinary discharge planning process, led by the attending physician.  Recommendations may be updated based on patient status, additional functional criteria and insurance authorization. ? ?Follow Up Recommendations Skilled nursing-short term rehab (<3 hours/day) ? ?  ?Assistance Recommended at Discharge Frequent or constant Supervision/Assistance  ?Patient can return home with the following ? Two people to help with walking and/or transfers;Two people to help with bathing/dressing/bathroom;Assistance with cooking/housework;Help with stairs or ramp for entrance;Assist for transportation ? ?  ?Equipment Recommendations Other (comment) (TBD)  ?Recommendations for Other Services ?    ?  ?Functional Status Assessment Patient has had a recent decline in their functional status and demonstrates the ability to make significant improvements in function in a reasonable and predictable amount of time.  ? ?  ?Precautions / Restrictions Precautions ?Precautions: Cervical;Fall ?Precaution Booklet Issued: No ?Precaution Comments: verbally educated in cervical precautions ?Required Braces or Orthoses: Cervical Brace ?Cervical  Brace: Hard collar;At all times ?Restrictions ?Weight Bearing Restrictions: No  ? ?  ? ?Mobility ? Bed Mobility ?Overal bed mobility: Needs Assistance ?Bed Mobility: Sit to Supine, Supine to Sit ?  ?  ?Supine to sit: +2 for physical assistance, Max assist ?Sit to supine: +2 for physical assistance, Mod assist ?  ?General bed mobility comments: assist for LEs over EOB, to raise trunk and position hips at EOB, guided trunk with assist for LEs back into bed, pt with increased pain in neck with mobility ?  ? ?Transfers ?Overall transfer level: Needs assistance ?Equipment used: 2 person hand held assist ?Transfers: Sit to/from Stand ?Sit to Stand: +2 physical assistance, Mod assist ?  ?  ?  ?  ?  ?General transfer comment: assist to rise and steady, pt able to pivot feet toward R, but did not take a true step at EOB ?  ? ?Ambulation/Gait ?  ?  ?  ?  ?  ?  ?  ?  ? ?Stairs ?  ?  ?  ?  ?  ? ?Wheelchair Mobility ?  ? ?Modified Rankin (Stroke Patients Only) ?  ? ?  ? ?Balance Overall balance assessment: Needs assistance ?Sitting-balance support: Bilateral upper extremity supported ?Sitting balance-Leahy Scale: Fair ?  ?  ?Standing balance support: Bilateral upper extremity supported ?Standing balance-Leahy Scale: Poor ?Standing balance comment: Reliant on BUE and external support ?  ?  ?  ?  ?  ?  ?  ?  ?  ?  ?  ?   ? ? ? ?Pertinent Vitals/Pain Pain Assessment ?Pain Assessment: Faces ?Faces Pain Scale: Hurts whole lot ?Pain Location: neck ?Pain Descriptors / Indicators: Moaning, Grimacing, Guarding, Discomfort ?Pain Intervention(s): Monitored during session, Limited activity within patient's tolerance, Repositioned  ? ? ?Home Living  Family/patient expects to be discharged to:: Private residence ?Living Arrangements: Alone ?Available Help at Discharge: Family;Available PRN/intermittently (cousin check on him weekly) ?Type of Home: House (duplex) ?Home Access: Stairs to enter ?Entrance Stairs-Rails: None ?Entrance Stairs-Number  of Steps: 1 ?  ?Home Layout: One level ?Home Equipment: Kasandra Knudsen - single point ?   ?  ?Prior Function Prior Level of Function : Independent/Modified Independent ?  ?  ?  ?  ?  ?  ?Mobility Comments: stopped using cane about a month ago ?ADLs Comments: sponge bathes ?  ? ? ?Hand Dominance  ? Dominant Hand: Right ? ?  ?Extremity/Trunk Assessment  ? Upper Extremity Assessment ?Upper Extremity Assessment: Defer to OT evaluation ?  ? ?Lower Extremity Assessment ?Lower Extremity Assessment: Generalized weakness ?  ? ?Cervical / Trunk Assessment ?Cervical / Trunk Assessment: Other exceptions ?Cervical / Trunk Exceptions: cervical fx in [place] J collar  ?Communication  ? Communication: Expressive difficulties (difficult to understand at times)  ?Cognition Arousal/Alertness: Awake/alert ?Behavior During Therapy: Flat affect ?Overall Cognitive Status: No family/caregiver present to determine baseline cognitive functioning ?  ?  ?  ?  ?  ?  ?  ?  ?  ?  ?  ?  ?  ?  ?  ?  ?General Comments: pt is a difficult historian, pt minimizing his ability to mobilize, self feed, per RN pt receiving medication for alcohol withdrawal ?  ?  ? ?  ?General Comments   ? ?  ?Exercises    ? ?Assessment/Plan  ?  ?PT Assessment Patient needs continued PT services  ?PT Problem List Decreased strength;Decreased range of motion;Decreased activity tolerance;Decreased balance;Decreased mobility;Decreased knowledge of use of DME;Decreased knowledge of precautions;Decreased cognition;Decreased safety awareness;Pain ? ?   ?  ?PT Treatment Interventions DME instruction;Gait training;Functional mobility training;Balance training;Neuromuscular re-education;Therapeutic exercise;Therapeutic activities;Stair training;Wheelchair mobility training   ? ?PT Goals (Current goals can be found in the Care Plan section)  ?Acute Rehab PT Goals ?Patient Stated Goal: to decrease pain ?PT Goal Formulation: With patient ?Time For Goal Achievement: 12/05/21 ?Potential to Achieve  Goals: Good ? ?  ?Frequency Min 3X/week ?  ? ? ?Co-evaluation PT/OT/SLP Co-Evaluation/Treatment: Yes ?Reason for Co-Treatment: For patient/therapist safety;To address functional/ADL transfers ?PT goals addressed during session: Mobility/safety with mobility;Balance ?  ?  ? ? ?  ?AM-PAC PT "6 Clicks" Mobility  ?Outcome Measure Help needed turning from your back to your side while in a flat bed without using bedrails?: A Lot ?Help needed moving from lying on your back to sitting on the side of a flat bed without using bedrails?: Total ?Help needed moving to and from a bed to a chair (including a wheelchair)?: Total ?Help needed standing up from a chair using your arms (e.g., wheelchair or bedside chair)?: Total ?Help needed to walk in hospital room?: Total ?Help needed climbing 3-5 steps with a railing? : Total ?6 Click Score: 7 ? ?  ?End of Session Equipment Utilized During Treatment: Cervical collar;Gait belt ?Activity Tolerance: Patient limited by pain ?Patient left: in bed;with call bell/phone within reach (on stretcher in ED) ?Nurse Communication: Mobility status ?PT Visit Diagnosis: Unsteadiness on feet (R26.81);Muscle weakness (generalized) (M62.81);Difficulty in walking, not elsewhere classified (R26.2) ?  ? ?Time: [id number] ?PT Time Calculation (min) (ACUTE ONLY): 24 min ? ? ?Charges:   PT Evaluation ?$PT Eval Moderate Complexity: 1 Mod ?  ?  ?   ? ? ?Reuel Derby, PT, DPT  ?Acute Rehabilitation Services  ?Pager: [phone] ?Office: [phone] ? ? ?  [place] ?11/21/2021, 2:06 PM ?

## 2021-11-21 NOTE — ED Notes (Signed)
RN assisted pt with breakfast tray, pt sitting up in bed at this time ?

## 2021-11-21 NOTE — ED Notes (Signed)
Male purwik removed due to placement error, full linen change preformed. Pt will try and use urinal at this time ? ?

## 2021-11-21 NOTE — ED Notes (Signed)
Pt continues to report pain in neck 8/10, pt also reports nausea and itching. Will preform CIWA at this time  ?

## 2021-11-21 NOTE — Progress Notes (Addendum)
? ?  HD#0 ?SUBJECTIVE:  ?Patient Summary:  ?Jake Powell is a 66 year old male with a PMHx of HTN, CKD 3a, alcohol use disorder, vertigo, and dementia (noted on in April of this year). He presented with a reported episode of syncope. Found to have a C2 non-displaced fracture and an EtOH level of 155.  ? ?Overnight Events:  ?NAEON ? ?Interim History:  ?Patient unable to stand for orthostatics this morning. Pt was seen at bedside during rounds this AM. States that he was drinking a lot the day before he came in. Last used cocaine three days ago. States that has had episodes of dizziness since he was 18 or 66 years old. Did not eat or drink anything yesterday. Typically drinks about 2-3 beers at a time, used to drink more but has cut back. Besides his neck, states that he feels a little bit better today. States that he lives alone. No other complaints or concerns at this time. ? ?OBJECTIVE:  ?Vital Signs: ?Vitals:  ? 11/21/21 0500 11/21/21 0506 11/21/21 0600 11/21/21 0652  ?BP: (!) 150/88  (!) 143/95 (!) 160/101  ?Pulse: 92 92 80 88  ?Resp: (!) 9 12 10 14   ?Temp:      ?TempSrc:      ?SpO2: 93% 92% 93% 95%  ?Weight:      ?Height:      ? ? ? ?Intake/Output Summary (Last 24 hours) at 11/21/2021 0838 ?Last data filed at 11/21/2021 0402 ?Gross per 24 hour  ?Intake 1000 ml  ?Output 700 ml  ?Net 300 ml  ? ?Net IO Since Admission: 300 mL [11/21/21 0838] ? ?Physical Exam: ?Constitutional: Appears disheveled. ?HENT: Normocephalic and atraumatic, EOMI, conjunctiva normal, moist mucous membranes ?Cardiovascular: Normal rate, regular rhythm, S1 and S2 present, no murmurs, rubs, gallops.  Distal pulses intact. ?Respiratory: Effort is normal on room air. CTAB. ?Abdominal: NTTP, +BS ?Musculoskeletal: Normal bulk and tone.  No peripheral edema noted. ?Skin: Warm and dry.  No rash, erythema, lesions noted. ?Neurological: Alert and oriented x4, no apparent focal deficits noted. ?Psychiatric: Normal mood and affect. Behavior is normal.    ? ? ?ASSESSMENT/PLAN:  ?Assessment: ?Jake Powell is a 66 year old male with a PMHx of HTN, CKD 3a, alcohol use disorder, vertigo, and dementia (noted on in April of this year). He presented with a reported episode of syncope. Found to have a C2 non-displaced fracture and an EtOH level of 155.  ? ? ?Plan: ?#Syncope vs alcohol intoxication ?#C2 non displaced fracture ?EtOH level of 155 and C2 fracture raises concern for a fall in the context of alcohol use. Patient reports an unexplained episode of syncope as he was walking to the store. Workup has been unremarkable thus far. Previous TTE from 1 yr ago with no AS. Has been on telemetry here for afib. CTH negative. Imaging evaluated by neurosurgery and plan for J-collar x3 mo before outpatient f/u. Problem presently is his inability to stand. ?-PT and OT c/s ?-Regular diet ?-OP neurosurgery f/u ? ?#AKI on CKD 3a ?#Hyponatremia ?Improved from 2.6 to 1.6 this morning s/p fluids. Hyponatremia likely 2/2 to hypovolemia.  ? ?#AUD ?#Cocaine use ?-CIWA ?-Folate and thiamine ?-TOC consult ? ?#Prediabetes  ?A1c 6.1. No SSI while inpatient.  ? ?FEN/GI: regular diet ?VTE: Lovenox ?Dispo: TBD ?Code Status: full ? ?Signature: ?May, MD ?PGY-1 ?Pager: [phone] ?Please contact the on call pager after 5 pm and on weekends at [phone].  ? ?

## 2021-11-21 NOTE — ED Notes (Signed)
Pt states itching all over. RN notified MD awaiting order  ?

## 2021-11-21 NOTE — ED Notes (Signed)
Wound cleansed with soap and sterile water. No drainage noted. Wound pat dry.  ?

## 2021-11-22 DIAGNOSIS — Z7982 Long term (current) use of aspirin: Secondary | ICD-10-CM | POA: Diagnosis not present

## 2021-11-22 DIAGNOSIS — R7303 Prediabetes: Secondary | ICD-10-CM | POA: Diagnosis present

## 2021-11-22 DIAGNOSIS — E871 Hypo-osmolality and hyponatremia: Secondary | ICD-10-CM | POA: Diagnosis present

## 2021-11-22 DIAGNOSIS — T796XXA Traumatic ischemia of muscle, initial encounter: Secondary | ICD-10-CM | POA: Diagnosis present

## 2021-11-22 DIAGNOSIS — I129 Hypertensive chronic kidney disease with stage 1 through stage 4 chronic kidney disease, or unspecified chronic kidney disease: Secondary | ICD-10-CM | POA: Diagnosis present

## 2021-11-22 DIAGNOSIS — Z20822 Contact with and (suspected) exposure to covid-19: Secondary | ICD-10-CM | POA: Diagnosis present

## 2021-11-22 DIAGNOSIS — F10230 Alcohol dependence with withdrawal, uncomplicated: Secondary | ICD-10-CM | POA: Diagnosis not present

## 2021-11-22 DIAGNOSIS — Z79899 Other long term (current) drug therapy: Secondary | ICD-10-CM | POA: Diagnosis not present

## 2021-11-22 DIAGNOSIS — R55 Syncope and collapse: Secondary | ICD-10-CM | POA: Diagnosis present

## 2021-11-22 DIAGNOSIS — E872 Acidosis, unspecified: Secondary | ICD-10-CM | POA: Diagnosis present

## 2021-11-22 DIAGNOSIS — S12101A Unspecified nondisplaced fracture of second cervical vertebra, initial encounter for closed fracture: Secondary | ICD-10-CM

## 2021-11-22 DIAGNOSIS — Y906 Blood alcohol level of 120-199 mg/100 ml: Secondary | ICD-10-CM | POA: Diagnosis present

## 2021-11-22 DIAGNOSIS — I672 Cerebral atherosclerosis: Secondary | ICD-10-CM | POA: Diagnosis present

## 2021-11-22 DIAGNOSIS — I4891 Unspecified atrial fibrillation: Secondary | ICD-10-CM | POA: Diagnosis not present

## 2021-11-22 DIAGNOSIS — R29810 Facial weakness: Secondary | ICD-10-CM | POA: Diagnosis present

## 2021-11-22 DIAGNOSIS — N179 Acute kidney failure, unspecified: Secondary | ICD-10-CM | POA: Diagnosis present

## 2021-11-22 DIAGNOSIS — R471 Dysarthria and anarthria: Secondary | ICD-10-CM | POA: Diagnosis present

## 2021-11-22 DIAGNOSIS — E861 Hypovolemia: Secondary | ICD-10-CM | POA: Diagnosis present

## 2021-11-22 DIAGNOSIS — R350 Frequency of micturition: Secondary | ICD-10-CM | POA: Diagnosis present

## 2021-11-22 DIAGNOSIS — N1831 Chronic kidney disease, stage 3a: Secondary | ICD-10-CM | POA: Diagnosis present

## 2021-11-22 DIAGNOSIS — F1090 Alcohol use, unspecified, uncomplicated: Secondary | ICD-10-CM | POA: Diagnosis not present

## 2021-11-22 DIAGNOSIS — F0390 Unspecified dementia without behavioral disturbance: Secondary | ICD-10-CM | POA: Diagnosis present

## 2021-11-22 DIAGNOSIS — F10229 Alcohol dependence with intoxication, unspecified: Secondary | ICD-10-CM | POA: Diagnosis present

## 2021-11-22 DIAGNOSIS — G40909 Epilepsy, unspecified, not intractable, without status epilepticus: Secondary | ICD-10-CM | POA: Diagnosis present

## 2021-11-22 DIAGNOSIS — N401 Enlarged prostate with lower urinary tract symptoms: Secondary | ICD-10-CM | POA: Diagnosis present

## 2021-11-22 LAB — BASIC METABOLIC PANEL
Anion gap: 6 (ref 5–15)
BUN: 14 mg/dL (ref 8–23)
CO2: 25 mmol/L (ref 22–32)
Calcium: 9.3 mg/dL (ref 8.9–10.3)
Chloride: 99 mmol/L (ref 98–111)
Creatinine, Ser: 1.23 mg/dL (ref 0.61–1.24)
GFR, Estimated: >60 mL/min (ref >60)
Glucose, Bld: 118 mg/dL — ABNORMAL HIGH (ref 70–99)
Potassium: 4.1 mmol/L (ref 3.5–5.1)
Sodium: 130 mmol/L — ABNORMAL LOW (ref 135–145)

## 2021-11-22 MED ORDER — AMLODIPINE BESYLATE 5 MG PO TABS
5.0000 mg | ORAL_TABLET | Freq: Every day | ORAL | Status: DC
  Administered 2021-11-22 – 2021-11-23 (×2): 5 mg via ORAL
  Filled 2021-11-22 (×2): qty 1

## 2021-11-22 MED ORDER — ENOXAPARIN SODIUM 40 MG/0.4ML IJ SOSY
40.0000 mg | PREFILLED_SYRINGE | INTRAMUSCULAR | Status: DC
  Administered 2021-11-22 – 2021-11-23 (×2): 40 mg via SUBCUTANEOUS
  Filled 2021-11-22 (×2): qty 0.4

## 2021-11-22 NOTE — Plan of Care (Signed)
Pt c/o pain at the begging of shift. RN stated it had been awhile since his last pain medication. PRN pain meds are controlling his pain. Pt sat on the side of the bed after breakfast. Bath given today. Pt also had a BM. ? ?Problem: Education: ?Goal: Knowledge of General Education information will improve ?Description: Including pain rating scale, medication(s)/side effects and non-pharmacologic comfort measures ?Outcome: Progressing ?  ?Problem: Health Behavior/Discharge Planning: ?Goal: Ability to manage health-related needs will improve ?Outcome: Progressing ?  ?Problem: Clinical Measurements: ?Goal: Ability to maintain clinical measurements within normal limits will improve ?Outcome: Progressing ?Goal: Will remain free from infection ?Outcome: Progressing ?Goal: Diagnostic test results will improve ?Outcome: Progressing ?Goal: Respiratory complications will improve ?Outcome: Progressing ?  ?Problem: Activity: ?Goal: Risk for activity intolerance will decrease ?Outcome: Progressing ?  ?Problem: Nutrition: ?Goal: Adequate nutrition will be maintained ?Outcome: Progressing ?  ?Problem: Pain Managment: ?Goal: General experience of comfort will improve ?Outcome: Progressing ?  ?Problem: Safety: ?Goal: Ability to remain free from injury will improve ?Outcome: Progressing ?  ?Problem: Skin Integrity: ?Goal: Risk for impaired skin integrity will decrease ?Outcome: Progressing ?  ?

## 2021-11-22 NOTE — Progress Notes (Signed)
? ?  HD#0 ?SUBJECTIVE:  ?Patient Summary:  ?Jake Powell is a 66 year old male with a PMHx of HTN, CKD 3a, alcohol use disorder, vertigo, and dementia (noted on in April of this year). He presented with a reported episode of syncope. Found to have a C2 non-displaced fracture and an EtOH level of 155.  ? ?Overnight Events:  ?NAEON ? ?Interim History:  ?PT and OT recommending SNF. Patient has met with LCSW to discuss process, patient is amenable to placement. Seen in his room this AM. No acute distress. Fully oriented.  ? ?OBJECTIVE:  ?Vital Signs: ?Vitals:  ? 11/22/21 0328 11/22/21 0415 11/22/21 0846 11/22/21 1124  ?BP: (!) 162/135 (!) 123/98 (!) 144/94 (!) 145/94  ?Pulse: 100 91 97 88  ?Resp: _0 ?Temp: 97.8 ?F (36.6 ?C)  98.3 ?F (36.8 ?C) 98.3 ?F (36.8 ?C)  ?TempSrc: Oral     ?SpO2: 97%  95% 96%  ?Weight:      ?Height:      ? ? ? ?Intake/Output Summary (Last 24 hours) at 11/22/2021 1220 ?Last data filed at 11/22/2021 0503 ?Gross per 24 hour  ?Intake 150 ml  ?Output --  ?Net 150 ml  ? ?Net IO Since Admission: 450 mL [11/22/21 1220] ? ?Physical Exam: ?Constitutional: Appears disheveled, wearing a J-collar ?HENT: Normocephalic and atraumatic, EOMI, conjunctiva normal, moist mucous membranes ?Cardiovascular: Normal rate, regular rhythm, S1 and S2 present, no murmurs, rubs, gallops.  Distal pulses intact. ?Respiratory: Effort is normal on room air. CTAB. ?Abdominal: NTTP, +BS ?Musculoskeletal: Normal bulk and tone.  No peripheral edema noted. ?Skin: Warm and dry.  No rash, erythema, lesions noted. ?Neurological: Alert and oriented x4, no apparent focal deficits noted. ?Psychiatric: Normal mood and affect. Behavior is normal.   ? ? ?ASSESSMENT/PLAN:  ?Assessment: ?Jake Powell is a 66 year old male with a PMHx of HTN, CKD 3a, alcohol use disorder, vertigo, and dementia (noted on in April of this year). He presented with a reported episode of syncope. Found to have a C2 non-displaced fracture and an EtOH level of 155.   ? ? ?Plan: ?#Syncope vs alcohol intoxication ?#C2 non displaced fracture ?EtOH level of 155 and C2 fracture raises concern for a fall in the context of alcohol use. Patient reports an unexplained episode of syncope as he was walking to the store. Workup has been unremarkable thus far. Previous TTE from 1 yr ago with no AS. Has been on telemetry here for afib. CTH negative. Imaging evaluated by neurosurgery and plan for J-collar x3wks before outpatient f/u. Problem presently is his inability to stand. ?-PT and OT c/s ?-Regular diet ?-OP neurosurgery f/u in 3 wks ? ?#AKI on CKD 3a (resolved) ?#Hyponatremia ?Cr improved from 2.6 to 1.2. Hyponatremia persistent at 130  ?-urine studies for hyponatremia ? ?#AUD ?#Cocaine use ?-CIWA ?-Folate and thiamine ?-TOC consult ? ?#Prediabetes  ?A1c 6.1. No SSI while inpatient.  ? ?FEN/GI: regular diet ?VTE: Lovenox ?Dispo: TBD ?Code Status: full ? ?Signature: ?Corky Sox, MD ?PGY-1 ?Pager: [phone] ?Please contact the on call pager after 5 pm and on weekends at [phone].  ? ?

## 2021-11-22 NOTE — NC FL2 (Signed)
?[place] MEDICAID FL2 LEVEL OF CARE SCREENING TOOL  ?  ? ?IDENTIFICATION  ?Patient Name: ?Jake Powell Birthdate: January 30, 1956 Sex: male Admission Date (Current Location): ?11/20/2021  ?[place] and [place] Number: ? [place] ?  Facility and Address:  ?[facility], [place] [address] [place], [place] [address] ?     Provider Number: ?[id number]  ?Attending Physician Name and Address:  ?Lucious Groves, DO ? Relative Name and Phone Number:  ?  ?   ?Current Level of Care: ?Hospital Recommended Level of Care: ?[place] Prior Approval Number: ?  ? ?Date Approved/Denied: ?  PASRR Number: ?[id number] ? ?Discharge Plan: ?SNF ?  ? ?Current Diagnoses: ?Patient Active Problem List  ? Diagnosis Date Noted  ? Alcohol use disorder   ? AKI (acute kidney injury) ([place])   ? Syncope 11/20/2021  ? ? ?Orientation RESPIRATION BLADDER Height & Weight   ?  ?Self, Time, Situation, Place ? Normal Continent Weight: 186 lb 8.2 oz (84.6 kg) ?Height:  6\' 2"  (188 cm)  ?BEHAVIORAL SYMPTOMS/MOOD NEUROLOGICAL BOWEL NUTRITION STATUS  ?    Continent Diet (regular)  ?AMBULATORY STATUS COMMUNICATION OF NEEDS Skin   ?Extensive Assist Verbally Normal ?  ?  ?  ?    ?     ?     ? ? ?Personal Care Assistance Level of Assistance  ?Bathing, Feeding, Dressing Bathing Assistance: Maximum assistance ?Feeding assistance: Limited assistance ?Dressing Assistance: Maximum assistance ?   ? ?Functional Limitations Info  ?Speech   ?  ?Speech Info: Impaired (dysarthria)  ? ? ?SPECIAL CARE FACTORS FREQUENCY  ?PT (By licensed PT), OT (By licensed OT)   ?  ?PT Frequency: 5x/wk ?OT Frequency: 5x/wk ?  ?  ?  ?   ? ? ?Contractures Contractures Info: Not present  ? ? ?Additional Factors Info  ?Code Status, Allergies Code Status Info: Full ?Allergies Info: None on file ?  ?  ?  ?   ? ?Current Medications (11/22/2021):  This is the current hospital active medication list ?Current Facility-Administered Medications  ?Medication Dose Route  Frequency Provider Last Rate Last Admin  ? acetaminophen (TYLENOL) tablet 650 mg  650 mg Oral Q6H PRN Rick Duff, MD      ? Or  ? acetaminophen (TYLENOL) suppository 650 mg  650 mg Rectal Q6H PRN Rick Duff, MD      ? amLODipine (NORVASC) tablet 5 mg  5 mg Oral Daily Corky Sox, MD   5 mg at 11/22/21 0919  ? enoxaparin (LOVENOX) injection 30 mg  30 mg Subcutaneous Q24H Rick Duff, MD   30 mg at [id number]  ? folic acid (FOLVITE) tablet 1 mg  1 mg Oral Daily Rick Duff, MD   1 mg at 11/22/21 0915  ? HYDROcodone-acetaminophen (NORCO/VICODIN) 5-325 MG per tablet 1-2 tablet  1-2 tablet Oral Q4H PRN Rick Duff, MD   2 tablet at 11/22/21 0914  ? hydrOXYzine (ATARAX) tablet 25 mg  25 mg Oral BID PRN Corky Sox, MD   25 mg at 11/21/21 2023  ? LORazepam (ATIVAN) tablet 1-4 mg  1-4 mg Oral Q1H PRN Rick Duff, MD   1 mg at 11/22/21 0209  ? Or  ? LORazepam (ATIVAN) injection 1-4 mg  1-4 mg Intravenous Q1H PRN Rick Duff, MD      ? multivitamin with minerals tablet 1 tablet  1 tablet Oral Daily Rick Duff, MD   1 tablet at 11/22/21 0915  ? senna-docusate (Senokot-S) tablet 1 tablet  1 tablet Oral QHS PRN Rick Duff, MD      ? thiamine tablet 100 mg  100 mg Oral Daily Rick Duff, MD   100 mg at 11/22/21 0915  ? ? ? ?Discharge Medications: ?Please see discharge summary for a list of discharge medications. ? ?Relevant Imaging Results: ? ?Relevant Lab Results: ? ? ?Additional Information ?SS#: [id number] ? ?Geralynn Ochs, LCSW ? ? ? ? ?

## 2021-11-22 NOTE — Plan of Care (Signed)

## 2021-11-22 NOTE — TOC Initial Note (Addendum)
Transition of Care (TOC) - Initial/Assessment Note  ? ? ?Patient Details  ?Name: Jake Powell ?MRN: [id number] ?Date of Birth: 02-07-1956 ? ?Transition of Care [facility]) CM/SW Contact:    ?Geralynn Ochs, LCSW ?Phone Number: ?11/22/2021, 12:12 PM ? ?Clinical Narrative:    CSW met with patient to discuss recommendation for SNF. Patient in agreement, says he can't go home and care for himself now if he can't even move his head. CSW faxed out referral, will follow with bed offers.  ? ?CSW also discussed with patient his substance use. Patient initially said he doesn't drink that much and he wasn't interested in any resources, but after further discussion patient said he'd take them and look them over. CSW to bring resources to patient to review.     ? ?UPDATE 3:25 PM: CSW met with patient to provide bed offers. Patient asking for a place closer to [facility] due to transportation concerns, and CSW reminded patient that he wouldn't have to worry about transportation as he would be staying there for a short time. Patient complaining about pain and asking for CSW to come back tomorrow to discuss more. CSW to follow.         ? ? ?Expected Discharge Plan: [place] ?Barriers to Discharge: Continued Medical Work up, Ship broker, Active Substance Use - Placement ? ? ?Patient Goals and CMS Choice ?Patient states their goals for this hospitalization and ongoing recovery are:: to be able to move his neck better again ?CMS Medicare.gov Compare Post Acute Care list provided to:: Patient ?Choice offered to / list presented to : Patient ? ?Expected Discharge Plan and Services ?Expected Discharge Plan: [place] ?  ?  ?Post Acute Care Choice: [place] ?Living arrangements for the past 2 months: [place] ?                ?  ?  ?  ?  ?  ?  ?  ?  ?  ?  ? ?Prior Living Arrangements/Services ?Living arrangements for the past 2 months: [place] ?Lives with::  Self ?Patient language and need for interpreter reviewed:: No ?Do you feel safe going back to the place where you live?: Yes      ?Need for Family Participation in Patient Care: No (Comment) ?Care giver support system in place?: No (comment) ?  ?Criminal Activity/Legal Involvement Pertinent to Current Situation/Hospitalization: No - Comment as needed ? ?Activities of Daily Living ?  ?  ? ?Permission Sought/Granted ?Permission sought to share information with : Customer service manager ?Permission granted to share information with : Yes, Verbal Permission Granted ?   ? Permission granted to share info w AGENCY: SNF ?   ?   ? ?Emotional Assessment ?Appearance:: Appears stated age ?Attitude/Demeanor/Rapport: Engaged ?Affect (typically observed): Pleasant ?Orientation: : Oriented to Place, Oriented to Self, Oriented to  Time, Oriented to Situation ?Alcohol / Substance Use: Alcohol Use ?Psych Involvement: No (comment) ? ?Admission diagnosis:  Syncope and collapse [R55] ?Syncope [R55] ?Closed nondisplaced fracture of second cervical vertebra, unspecified fracture morphology, initial encounter ([place]) [S12.101A] ?Patient Active Problem List  ? Diagnosis Date Noted  ? Alcohol use disorder   ? AKI (acute kidney injury) ([place])   ? Syncope 11/20/2021  ? ?PCP:  Pcp, No ?Pharmacy:   ?[id number] - [place], [place] - [address] ?[place] ?[place] [address] ?Phone: [phone]  Fax: [phone] ? ? ? ? ?Social Determinants of Health (SDOH) Interventions ?  ? ?Readmission Risk Interventions ?   ? View : No data to display.  ?  ?  ?  ? ? ? ?

## 2021-11-22 NOTE — Care Management Obs Status (Signed)
MEDICARE OBSERVATION STATUS NOTIFICATION ? ? ?Patient Details  ?Name: Jake Powell ?MRN: [id number] ?Date of Birth: 1956-03-23 ? ? ?Medicare Observation Status Notification Given:  Yes ? ? ? ?Geralynn Ochs, LCSW ?11/22/2021, 12:03 PM ?

## 2021-11-23 DIAGNOSIS — T796XXA Traumatic ischemia of muscle, initial encounter: Secondary | ICD-10-CM | POA: Diagnosis present

## 2021-11-23 LAB — BASIC METABOLIC PANEL
Anion gap: 9 (ref 5–15)
BUN: 16 mg/dL (ref 8–23)
CO2: 22 mmol/L (ref 22–32)
Calcium: 9 mg/dL (ref 8.9–10.3)
Chloride: 98 mmol/L (ref 98–111)
Creatinine, Ser: 1.51 mg/dL — ABNORMAL HIGH (ref 0.61–1.24)
GFR, Estimated: 50 mL/min — ABNORMAL LOW (ref >60)
Glucose, Bld: 99 mg/dL (ref 70–99)
Potassium: 4.9 mmol/L (ref 3.5–5.1)
Sodium: 129 mmol/L — ABNORMAL LOW (ref 135–145)

## 2021-11-23 LAB — OSMOLALITY: Osmolality: 282 mOsm/kg (ref 275–295)

## 2021-11-23 MED ORDER — HYDROCODONE-ACETAMINOPHEN 5-325 MG PO TABS: 1.0000 | ORAL_TABLET | ORAL | 0 refills | Status: DC | PRN

## 2021-11-23 MED ORDER — METHOCARBAMOL 750 MG PO TABS
750.0000 mg | ORAL_TABLET | Freq: Three times a day (TID) | ORAL | Status: DC | PRN
  Administered 2021-11-23 (×2): 750 mg via ORAL
  Filled 2021-11-23 (×2): qty 1

## 2021-11-23 MED ORDER — FOLIC ACID 1 MG PO TABS: 1.0000 mg | ORAL_TABLET | Freq: Every day | ORAL | 0 refills | Status: AC

## 2021-11-23 MED ORDER — METHOCARBAMOL 750 MG PO TABS: 750.0000 mg | ORAL_TABLET | Freq: Four times a day (QID) | ORAL | 0 refills | Status: AC | PRN

## 2021-11-23 MED ORDER — HYDROCODONE-ACETAMINOPHEN 5-325 MG PO TABS: 1.0000 | ORAL_TABLET | ORAL | 0 refills | Status: AC | PRN

## 2021-11-23 MED ORDER — METHOCARBAMOL 750 MG PO TABS
750.0000 mg | ORAL_TABLET | Freq: Four times a day (QID) | ORAL | 0 refills | Status: DC | PRN
Start: 2021-11-23 — End: 2021-11-23

## 2021-11-23 MED ORDER — FOLIC ACID 1 MG PO TABS: 1.0000 mg | ORAL_TABLET | Freq: Every day | ORAL | 0 refills | Status: DC

## 2021-11-23 NOTE — Progress Notes (Signed)
Physical Therapy Treatment ?Patient Details ?Name: Jake Powell ?MRN: [id number] ?DOB: 22-Apr-1956 ?Today's Date: 11/23/2021 ? ? ?History of Present Illness Pt is a 66 year old man who presented on 11/20/21 after syncopal episode outside of a store. CT + C2 non displaced vertebral body fx. Pt with AKI, hyponatremia, elevated transaminase level. Head CT negative. PMH: HTN, dizziness, cocaine and alcohol abuse, BPH. ? ?  ?PT Comments  ? ? Pt was seen for mobility on side of bed with help for standing and sidestepping to the chair.  After initially doing bed ex due to declining OOB from pain, pt was more willing and able to make the transition to the chair.  Pt is going to still be recommended to SNF care, with pt requiring significant help to move, has unmanaged pain and is a bit fearful to move from the bed.  Follow acutely for goals of PT as are written in POC.   ?Recommendations for follow up therapy are one component of a multi-disciplinary discharge planning process, led by the attending physician.  Recommendations may be updated based on patient status, additional functional criteria and insurance authorization. ? ?Follow Up Recommendations ? Skilled nursing-short term rehab (<3 hours/day) ?  ?  ?Assistance Recommended at Discharge Frequent or constant Supervision/Assistance  ?Patient can return home with the following Two people to help with walking and/or transfers;A lot of help with bathing/dressing/bathroom;Assistance with cooking/housework;Assist for transportation;Help with stairs or ramp for entrance ?  ?Equipment Recommendations ? Other (comment) (determine at SNF)  ?  ?Recommendations for Other Services   ? ? ?  ?Precautions / Restrictions Precautions ?Precautions: Cervical;Fall ?Precaution Comments: verbally educated in cervical precautions ?Cervical Brace: Hard collar;At all times ?Restrictions ?Weight Bearing Restrictions: No  ?  ? ?Mobility ? Bed Mobility ?Overal bed mobility: Needs Assistance ?Bed  Mobility: Supine to Sit ?  ?  ?Supine to sit: Mod assist ?  ?  ?  ?  ? ?Transfers ?Overall transfer level: Needs assistance ?Equipment used: 1 person hand held assist ?Transfers: Sit to/from Stand, Bed to chair/wheelchair/BSC ?Sit to Stand: Mod assist ?  ?Step pivot transfers: Min assist ?  ?  ?  ?General transfer comment: pt reports his pain is limitation but legs are not ?  ? ?Ambulation/Gait ?  ?  ?  ?  ?  ?  ?  ?General Gait Details: transfers only ? ? ?Stairs ?  ?  ?  ?  ?  ? ? ?Wheelchair Mobility ?  ? ?Modified Rankin (Stroke Patients Only) ?  ? ? ?  ?Balance Overall balance assessment: Needs assistance ?Sitting-balance support: Feet supported ?Sitting balance-Leahy Scale: Fair ?  ?  ?Standing balance support: Bilateral upper extremity supported, During functional activity ?Standing balance-Leahy Scale: Fair ?Standing balance comment: requires balance and steadying assist to sidestep to chair, pain being his main concern ?  ?  ?  ?  ?  ?  ?  ?  ?  ?  ?  ?  ? ?  ?Cognition Arousal/Alertness: Awake/alert ?Behavior During Therapy: Flat affect ?Overall Cognitive Status: No family/caregiver present to determine baseline cognitive functioning ?  ?  ?  ?  ?  ?  ?  ?  ?  ?  ?  ?  ?  ?  ?  ?  ?General Comments: pt is a bit confused initially about the source of his pain, talked about pain being like his neck had been broken, and discussed with him it had.  Seems to have some  difficulty understanding the source of the issue.  Pt felt falling on concrete should not have caused this ?  ?  ? ?  ?Exercises General Exercises - Lower Extremity ?Ankle Circles/Pumps: AAROM, 5 reps ?Quad Sets: AROM, 10 reps ?Gluteal Sets: AROM, 10 reps ?Long Arc Quad: AROM, 10 reps ?Heel Slides: AROM, 10 reps ?Hip ABduction/ADduction: AROM, 10 reps ?Straight Leg Raises: AROM, 10 reps ? ?  ?General Comments General comments (skin integrity, edema, etc.): pt was seen for mobility on side of bed to stand HHA and get to chair, with pain in neck  spasming and causing him to yell out.  Pt reports any contact on shoulders is not an issue but cannot tolerate back support in upper thoracic area ?  ?  ? ?Pertinent Vitals/Pain Pain Assessment ?Pain Assessment: Faces ?Faces Pain Scale: Hurts whole lot ?Pain Location: neck ?Pain Descriptors / Indicators: Headache, Guarding, Grimacing ?Pain Intervention(s): Limited activity within patient's tolerance, Monitored during session, Premedicated before session, Repositioned, Patient requesting pain meds-RN notified  ? ? ?Home Living   ?  ?  ?  ?  ?  ?  ?  ?  ?  ?   ?  ?Prior Function    ?  ?  ?   ? ?PT Goals (current goals can now be found in the care plan section) Acute Rehab PT Goals ?Patient Stated Goal: to decrease pain ?Progress towards PT goals: Progressing toward goals ? ?  ?Frequency ? ? ? Min 3X/week ? ? ? ?  ?PT Plan Current plan remains appropriate  ? ? ?Co-evaluation   ?  ?  ?  ?  ? ?  ?AM-PAC PT "6 Clicks" Mobility   ?Outcome Measure ? Help needed turning from your back to your side while in a flat bed without using bedrails?: A Lot ?Help needed moving from lying on your back to sitting on the side of a flat bed without using bedrails?: A Lot ?Help needed moving to and from a bed to a chair (including a wheelchair)?: A Lot ?Help needed standing up from a chair using your arms (e.g., wheelchair or bedside chair)?: A Lot ?Help needed to walk in hospital room?: Total ?Help needed climbing 3-5 steps with a railing? : Total ?6 Click Score: 10 ? ?  ?End of Session Equipment Utilized During Treatment: Cervical collar ?Activity Tolerance: Patient limited by pain ?Patient left: in chair;with call bell/phone within reach;with chair alarm set;with nursing/sitter in room ?Nurse Communication: Mobility status ?PT Visit Diagnosis: Unsteadiness on feet (R26.81);Muscle weakness (generalized) (M62.81);Difficulty in walking, not elsewhere classified (R26.2) ?  ? ? ?Time: 7096-2836 ?PT Time Calculation (min) (ACUTE ONLY): 39  min ? ?Charges:  $Therapeutic Exercise: 8-22 mins ?$Therapeutic Activity: 23-37 mins ?Ivar Drape ?11/23/2021, 12:24 PM ? ?Samul Dada, PT PhD ?Acute Rehab Dept. Number: [facility] [phone] and [facility] [phone] ? ? ?

## 2021-11-23 NOTE — Progress Notes (Signed)
Report given to [place] Char[employer] fundraiser at [employer]. Answered all questions and concerns. PTAR still pending arrival. Will monitor.  ?

## 2021-11-23 NOTE — TOC Transition Note (Signed)
Transition of Care (TOC) - CM/SW Discharge Note ? ? ?Patient Details  ?Name: Jake Powell ?MRN: [id number] ?Date of Birth: [birth year] ? ?Transition of Care [facility]) CM/SW Contact:  ?Baldemar Lenis, LCSW ?Phone Number: ?11/23/2021, 2:41 PM ? ? ?Clinical Narrative:   CSW alerted by RN this morning that patient was requesting either [facility] or another facility in the [place] area. CSW contacted [facility], [facility], and [facility]. [facility] accepted, the other two declined. CSW initiated insurance authorization and it was received, [facility] can admit today.  ? ?Nurse to call report to [phone], Room 208. ? ? ? ?Final next level of care: Skilled Nursing Facility ?Barriers to Discharge: Barriers Resolved ? ? ?Patient Goals and CMS Choice ?Patient states their goals for this hospitalization and ongoing recovery are:: to be able to move his neck better again ?CMS Medicare.gov Compare Post Acute Care list provided to:: Patient ?Choice offered to / list presented to : Patient ? ?Discharge Placement ?  ?           ?[facility] ?Patient to be transferred to facility by: PTAR ?Name of family member notified: Self ?Patient and family notified of of transfer: 11/23/21 ? ?Discharge Plan and Services ?  ?  ?Post Acute Care Choice: Skilled Nursing Facility          ?  ?  ?  ?  ?  ?  ?  ?  ?  ?  ? ?Social Determinants of Health (SDOH) Interventions ?  ? ? ?Readmission Risk Interventions ?   ? View : No data to display.  ?  ?  ?  ? ? ? ? ? ?

## 2021-11-23 NOTE — Discharge Summary (Addendum)
? ?Name: Jake Powell ?MRN: [id number] ?DOB: 1956-03-06 66 y.o. ?PCP: Pcp, No ? ?Date of Admission: 11/20/2021 12:03 PM ?Date of Discharge: 11/23/2021 ?Attending Physician: Lucious Groves, DO ? ?Discharge Diagnosis: ?1. Non-displaced C2 fracture ?2. Acute alcohol intoxication (resolved) ?3. 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 Hyponatremia (chronic) ?5. Alcohol use disorder ?6. Cocaine use ?7. Prediabetes ?8. Traumatic Rhabdomyolysis  ? ?Discharge Medications: ?Allergies as of 11/23/2021   ?Not on File ?  ? ?  ?Medication List  ?  ? ?STOP taking these medications   ? ?acetaminophen 325 MG tablet ?Commonly known as: TYLENOL ?  ?famotidine 20 MG tablet ?Commonly known as: PEPCID ?  ?propranolol 20 MG tablet ?Commonly known as: INDERAL ?  ?tiZANidine 4 MG tablet ?Commonly known as: ZANAFLEX ?  ? ?  ? ?TAKE these medications   ? ?amLODipine 5 MG tablet ?Commonly known as: NORVASC ?Take 1 tablet by mouth daily. ?  ?aspirin 81 MG EC tablet ?Take 1 tablet by mouth daily. ?  ?busPIRone 5 MG tablet ?Commonly known as: BUSPAR ?Take 5 mg by mouth 2 (two) times daily as needed for anxiety. ?  ?folic acid 1 MG tablet ?Commonly known as: FOLVITE ?Take 1 tablet (1 mg total) by mouth daily. ?Start taking on: Nov 24, 2021 ?  ?HYDROcodone-acetaminophen 5-325 MG tablet ?Commonly known as: NORCO/VICODIN ?Take 1 tablet by mouth every 4 (four) hours as needed for up to 5 days for moderate pain. ?  ?lisinopril 10 MG tablet ?Commonly known as: ZESTRIL ?Take 10 mg by mouth daily. ?  ?Meclizine HCl 25 MG Chew ?Chew 1 tablet by mouth daily as needed. ?  ?methocarbamol 750 MG tablet ?Commonly known as: ROBAXIN ?Take 1 tablet (750 mg total) by mouth every 6 (six) hours as needed for muscle spasms. ?  ?omeprazole 20 MG tablet ?Commonly known as: PRILOSEC OTC ?Take 20 mg by mouth daily. ?  ?Quintabs Tabs ?Take 1 tablet by mouth daily. ?  ?thiamine 100 MG tablet ?Take 100 mg by mouth daily. ?  ? ?  ? ? ?Disposition and follow-up:   ?Mr.Jake Powell was  discharged from [facility] in Stable condition.  At the hospital follow up visit please address: ? ?1.  C-spine stability, alcohol use ? ?2.  Labs / imaging needed at time of follow-up: per neurosurgery ? ?3.  Pending labs/ test needing follow-up: NA ? ?Follow-up Appointments: ? Contact information for follow-up providers   ? ? Consuella Lose, MD. Schedule an appointment as soon as possible for a visit.   ?Specialty: Neurosurgery ?Contact information: ?[address] ?[address] ?[place] [address] ?[phone] ? ? ?  ?  ? ?  ?  ? ? Contact information for after-discharge care   ? ? Destination   ? ? [facility] .   ?Service: Skilled Nursing ?Contact information: ?[address] ?[place] ?[phone] ? ?  ?  ? ?  ?  ? ?  ?  ? ?  ? ? ?Hospital Course by problem list: ? ?1. Non-displaced C2 fracture ?2. Acute alcohol intoxication (resolved) ?Patient found by EMS slumped over on a bench and was brought in as a code stroke. EtOH level found to be 155 and found to have a C2 non-displaced fracture. Suspect fall during an episode of alcohol intoxication. Neurosurgery was consulted and they recommended non-operative management with follow up with Dr. Kathyrn Sheriff in 3 weeks. Until that time the patient is to remain in his [place] collar.  Discharged to SNF. ? ?3. AKI (resolved) on CKD3a ?Creatinine of 2.6 initially. Normalized to his baseline of 1.2-1.5 after a few days.  ? ?4. Hyponatremia (chronic) ?Na of 129-133 while admitted. Previous records show he is chronically anywhere from 128-135.  ? ?5. Alcohol use disorder ?6. Cocaine use ?Counseled on abstinence or reduction in use. Given multivitamin and folate/thiamine. ? ?7. Prediabetes ?A1C of 6.1. No need for SSI while inpatient. ? ?8. Traumatic Rhabdomyolysis ?- CK elevated on presentation, improved with IVF. ? ?Discharge Exam:   ?BP (!) 154/112 (BP Location: Right Arm)   Pulse 96   Temp 98 ?F (36.7 ?C)  (Oral)   Resp 16   Ht 6\' 2"  (1.88 m)   Wt 84.6 kg   SpO2 98%   BMI 23.95 kg/m?  ?Constitutional: Appears disheveled, wearing a J-collar ?HENT: Normocephalic and atraumatic, EOMI, conjunctiva normal, moist mucous membranes ?Cardiovascular: Normal rate, regular rhythm, S1 and S2 present, no murmurs, rubs, gallops.  Distal pulses intact. ?Respiratory: Effort is normal on room air. CTAB. ?Abdominal: NTTP, +BS ?Musculoskeletal: Normal bulk and tone.  No peripheral edema noted. ?Skin: Warm and dry.  No rash, erythema, lesions noted. ?Neurological: Alert and oriented x4, no apparent focal deficits noted. ?Psychiatric: Normal mood and affect. Behavior is normal.   ? ?Pertinent Labs, Studies, and Procedures:  ?CT-C-Spine as above ? ?Discharge Instructions: ?Discharge Instructions   ? ? Diet - low sodium heart healthy   Complete by: As directed ?  ? Increase activity slowly   Complete by: As directed ?  ? ?  ? ? ?Signed: ?Corky Sox, MD ?PGY-1 ? ? ?

## 2021-11-23 NOTE — Progress Notes (Signed)
Occupational Therapy Treatment ?Patient Details ?Name: Jake Powell ?MRN: [id number] ?DOB: 1955/12/05 ?Today's Date: 11/23/2021 ? ? ?History of present illness Pt is a 66 year old man who presented on 11/20/21 after syncopal episode outside of a store. CT + C2 non displaced vertebral body fx. Pt with AKI, hyponatremia, elevated transaminase level. Head CT negative. PMH: HTN, dizziness, cocaine and alcohol abuse, BPH. ?  ?OT comments ? Pt distracted by increased neck pain. Reported he tolerated about 30 minutes in chair. Pt back in bed and declined OOB, but agreed to sit EOB and participated in grooming and UB bathing with min to mod assist. Reinforced cervical precautions and correct position of cervical brace using mirror.   ? ?Recommendations for follow up therapy are one component of a multi-disciplinary discharge planning process, led by the attending physician.  Recommendations may be updated based on patient status, additional functional criteria and insurance authorization. ?   ?Follow Up Recommendations ? Skilled nursing-short term rehab (<3 hours/day)  ?  ?Assistance Recommended at Discharge Frequent or constant Supervision/Assistance  ?Patient can return home with the following ? A lot of help with bathing/dressing/bathroom;Two people to help with walking and/or transfers;Assistance with feeding;Assist for transportation;Help with stairs or ramp for entrance ?  ?Equipment Recommendations ? Other (comment) (defer to next venue)  ?  ?Recommendations for Other Services   ? ?  ?Precautions / Restrictions Precautions ?Precautions: Cervical;Fall ?Precaution Comments: verbally educated in cervical precautions ?Required Braces or Orthoses: Cervical Brace ?Cervical Brace: Hard collar;At all times ?Restrictions ?Weight Bearing Restrictions: No  ? ? ?  ? ?Mobility Bed Mobility ?Overal bed mobility: Needs Assistance ?Bed Mobility: Supine to Sit, Sit to Supine ?  ?  ?Supine to sit: Mod assist ?Sit to supine: Mod assist ?   ?General bed mobility comments: increased time, assist to raise trunk and for LEs back into bed ?  ? ?Transfers ?  ?  ?  ?  ?  ?  ?  ?  ?  ?General transfer comment: pt declined, stated he had already stood and sat in the chair today and he was not up to doing it again ?  ?  ?Balance Overall balance assessment: Needs assistance ?Sitting-balance support: Feet supported ?Sitting balance-Leahy Scale: Fair ?  ?  ?  ?  ?  ?  ?  ?  ?  ?  ?  ?  ?  ?  ?  ?  ?   ? ?ADL either performed or assessed with clinical judgement  ? ?ADL Overall ADL's : Needs assistance/impaired ?  ?  ?Grooming: Oral care;Sitting;Minimal assistance ?  ?Upper Body Bathing: Moderate assistance;Sitting ?Upper Body Bathing Details (indicate cue type and reason): washed and dried back ?  ?  ?  ?  ?  ?  ?  ?  ?  ?  ?  ?  ?  ?General ADL Comments: Adjusted C-collar. ?  ? ?Extremity/Trunk Assessment   ?  ?  ?  ?  ?  ? ?Vision   ?  ?  ?Perception   ?  ?Praxis   ?  ? ?Cognition Arousal/Alertness: Awake/alert ?Behavior During Therapy: Flat affect ?Overall Cognitive Status: No family/caregiver present to determine baseline cognitive functioning ?  ?  ?  ?  ?  ?  ?  ?  ?  ?  ?  ?  ?  ?  ?  ?  ?General Comments: pt highly distracted by pain, educated that his extremities have not been affected by his  cervical injury ?  ?  ?   ?Exercises   ? ?  ?Shoulder Instructions   ? ? ?  ?General Comments pt was seen for mobility on side of bed to stand HHA and get to chair, with pain in neck spasming and causing him to yell out.  Pt reports any contact on shoulders is not an issue but cannot tolerate back support in upper thoracic area  ? ? ?Pertinent Vitals/ Pain       Pain Assessment ?Pain Assessment: Faces ?Faces Pain Scale: Hurts whole lot ?Pain Location: neck ?Pain Descriptors / Indicators: Headache, Guarding, Grimacing ?Pain Intervention(s): Premedicated before session, Repositioned ? ?Home Living   ?  ?  ?  ?  ?  ?  ?  ?  ?  ?  ?  ?  ?  ?  ?  ?  ?  ?  ? ?  ?Prior  Functioning/Environment    ?  ?  ?  ?   ? ?Frequency ? Min 2X/week  ? ? ? ? ?  ?Progress Toward Goals ? ?OT Goals(current goals can now be found in the care plan section) ? Progress towards OT goals: Progressing toward goals ? ?Acute Rehab OT Goals ?OT Goal Formulation: With patient ?Time For Goal Achievement: 12/05/21 ?Potential to Achieve Goals: Good  ?Plan Discharge plan remains appropriate   ? ?Co-evaluation ? ? ?   ?  ?  ?  ?  ? ?  ?AM-PAC OT "6 Clicks" Daily Activity     ?Outcome Measure ? ? Help from another person eating meals?: A Little ?Help from another person taking care of personal grooming?: A Little ?Help from another person toileting, which includes using toliet, bedpan, or urinal?: Total ?Help from another person bathing (including washing, rinsing, drying)?: A Lot ?Help from another person to put on and taking off regular upper body clothing?: A Lot ?Help from another person to put on and taking off regular lower body clothing?: Total ?6 Click Score: 12 ? ?  ?End of Session Equipment Utilized During Treatment: Cervical collar ? ?OT Visit Diagnosis: Unsteadiness on feet (R26.81);Pain;Muscle weakness (generalized) (M62.81) ?  ?Activity Tolerance Patient tolerated treatment well ?  ?Patient Left in bed;with call bell/phone within reach;with bed alarm set ?  ?Nurse Communication   ?  ? ?   ? ?Time: 1310-1325 ?OT Time Calculation (min): 15 min ? ?Charges: OT General Charges ?$OT Visit: 1 Visit ?OT Treatments ?$Self Care/Home Management : 8-22 mins ? ?Martie Round, OTR/L ?Acute Rehabilitation Services ?Pager: [phone] ?Office: [phone]  ? ?Evern Bio ?11/23/2021, 1:34 PM ?

## 2022-01-16 ENCOUNTER — Telehealth (HOSPITAL_BASED_OUTPATIENT_CLINIC_OR_DEPARTMENT_OTHER): Payer: Self-pay

## 2022-01-16 ENCOUNTER — Other Ambulatory Visit (HOSPITAL_BASED_OUTPATIENT_CLINIC_OR_DEPARTMENT_OTHER): Payer: Self-pay | Admitting: Neurosurgery

## 2022-01-16 DIAGNOSIS — S12190A Other displaced fracture of second cervical vertebra, initial encounter for closed fracture: Secondary | ICD-10-CM

## 2022-03-08 ENCOUNTER — Ambulatory Visit (HOSPITAL_BASED_OUTPATIENT_CLINIC_OR_DEPARTMENT_OTHER)
Admission: RE | Admit: 2022-03-08 | Discharge: 2022-03-08 | Disposition: A | Discharge status: Home / Self Care | Payer: Medicare Other | Source: Ambulatory Visit | Attending: Neurosurgery | Admitting: Neurosurgery

## 2022-03-08 DIAGNOSIS — S12190A Other displaced fracture of second cervical vertebra, initial encounter for closed fracture: Secondary | ICD-10-CM | POA: Diagnosis present

## 2022-09-05 IMAGING — CT CT HEAD CODE STROKE
5 of 7 series · 18 of 47 positions shown, 19 images · non-contrast
Comparison: None Available.

CLINICAL DATA: Code stroke.



[Series 3: head 5.0 st · axial · 0.44mm/px · z∈[-106,-26]mm · 3 of 33 slices shown, 4 images]
[im 9/33  brain]
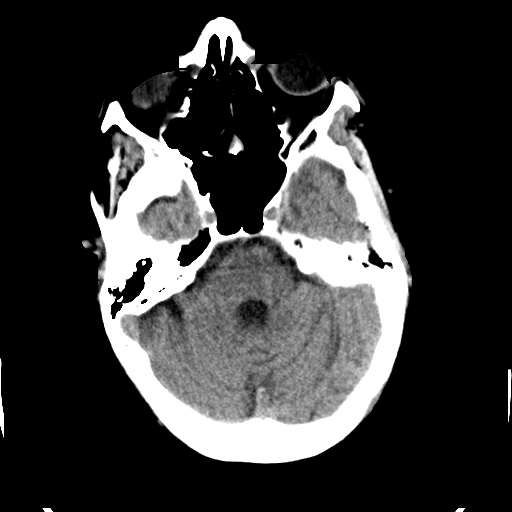
[im 9/33  bone]
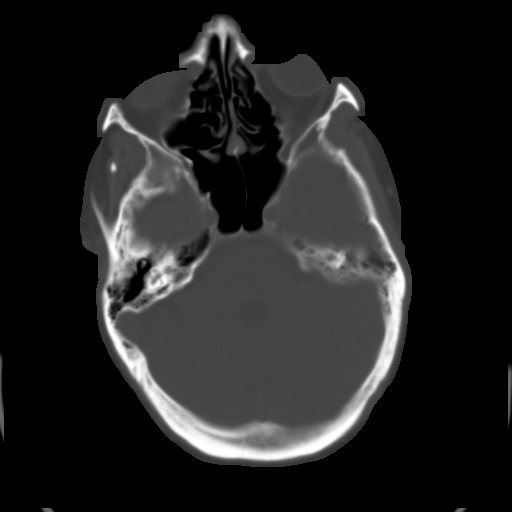
[im 17/33  brain]
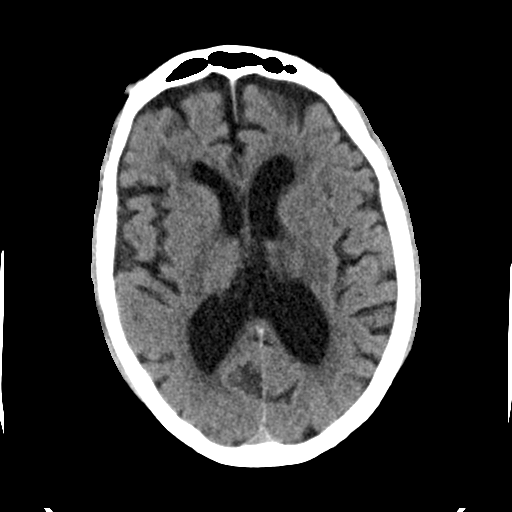
[im 25/33  brain]
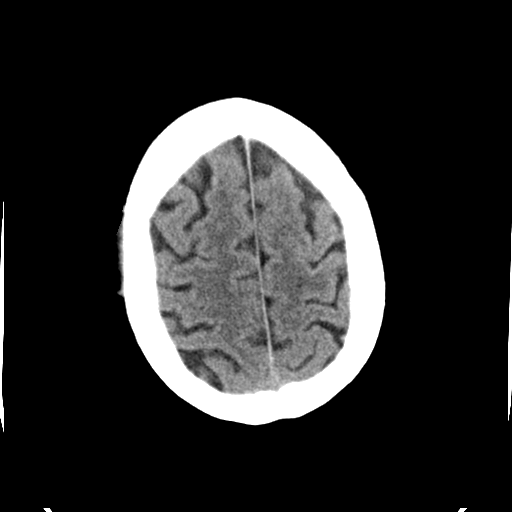

[Series 5: head 3.0 cor st · coronal · 0.34mm/px · 3 of 69 slices shown]
[im 18/69  brain]
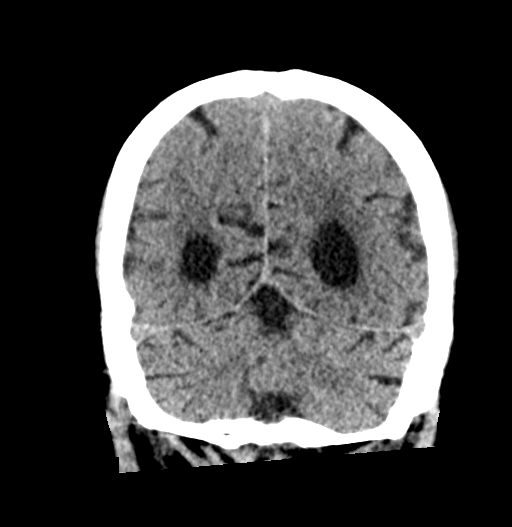
[im 35/69  brain]
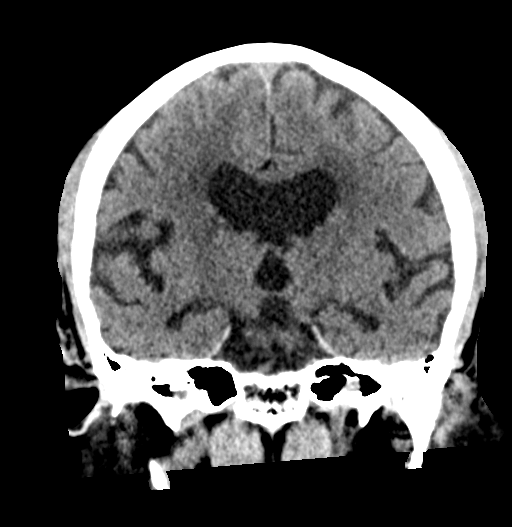
[im 52/69  brain]
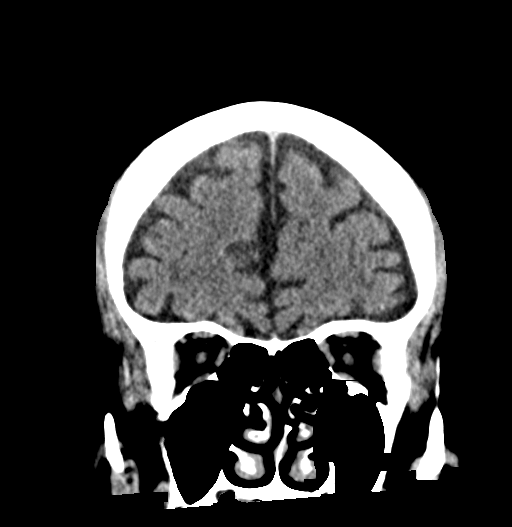

[Series 6: head 3.0 sag st · sagittal · 0.31mm/px · 1 of 65 slices shown]
[im 33/65  brain]
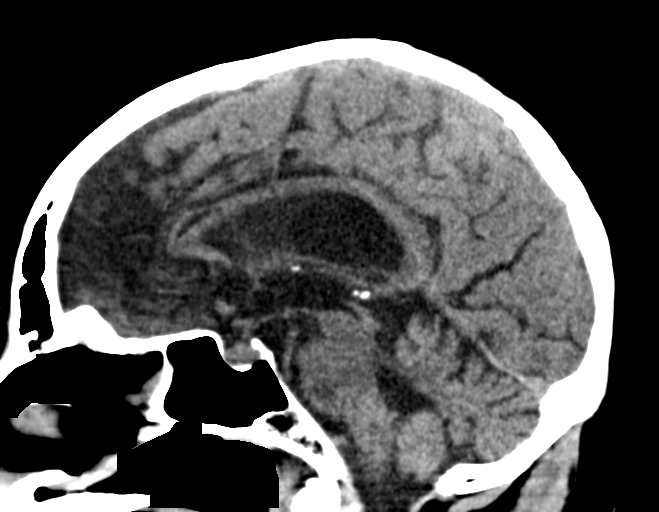

[Series 7: c_spine 2.0 st · axial · 0.41mm/px · z∈[-258,-216]mm · 3 of 76 slices shown]
[im 7/76  brain]
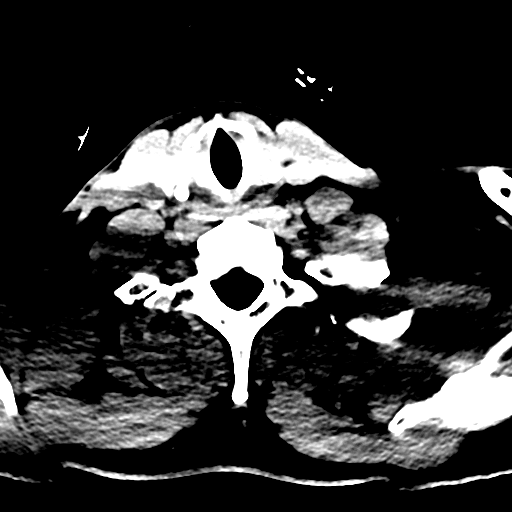
[im 14/76  brain]
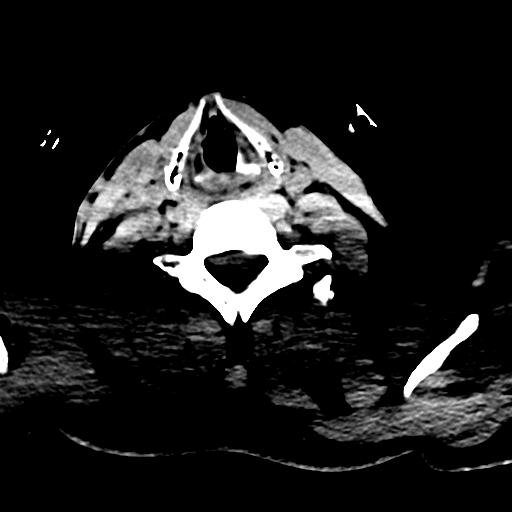
[im 28/76  brain]
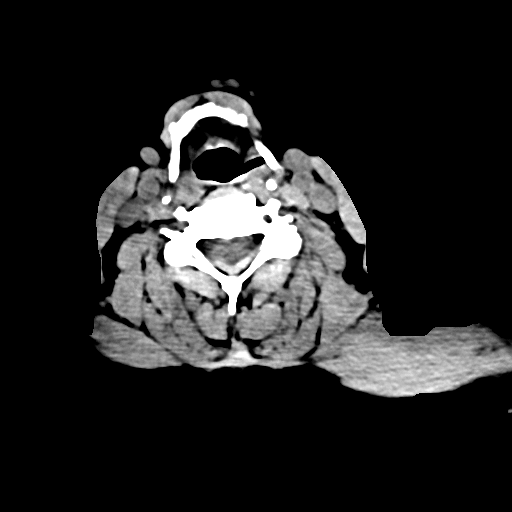

[Series 12: c_spine 2.0 orthogonals · axial · 0.21mm/px · z∈[-276,-137]mm · 8 of 79 slices shown]
[im 7/79  brain]
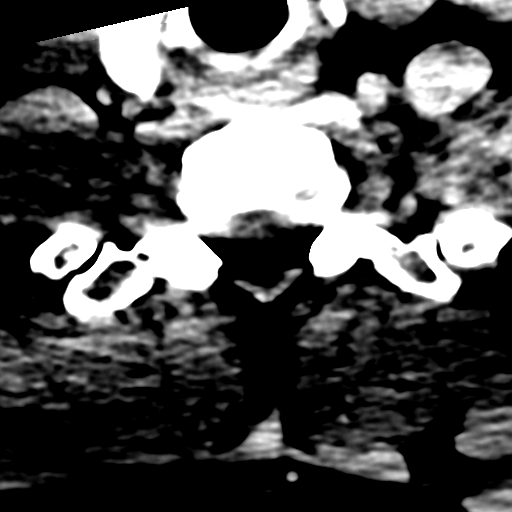
[im 20/79  brain]
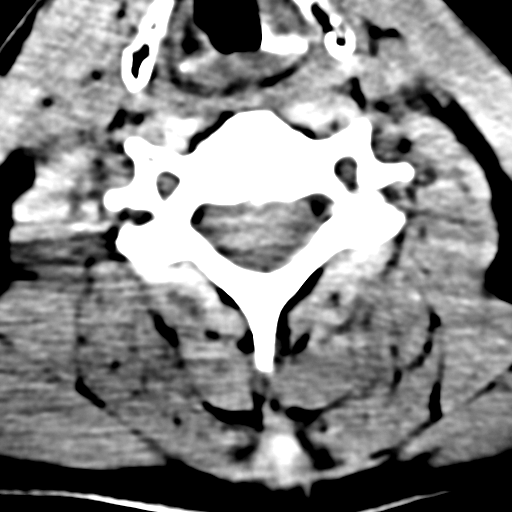
[im 27/79  brain]
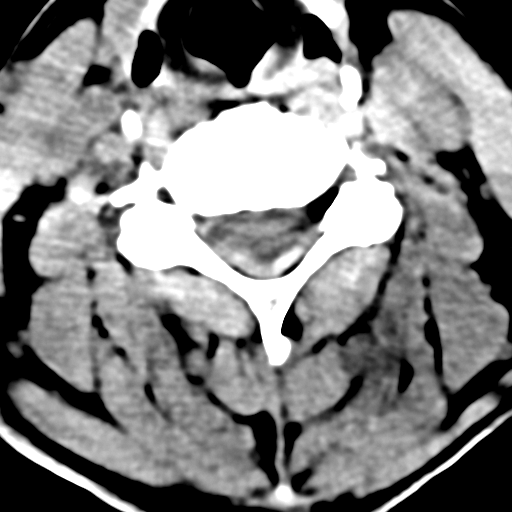
[im 33/79  brain]
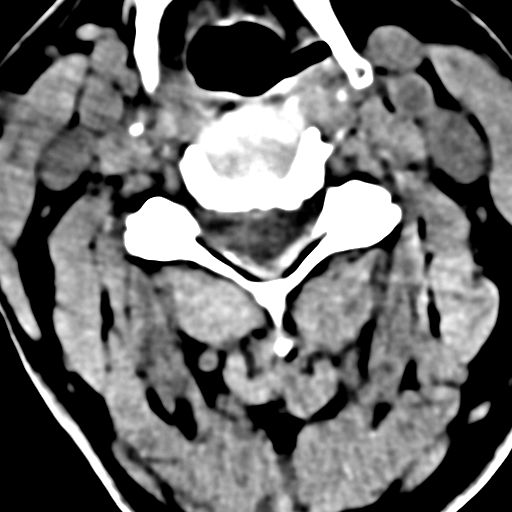
[im 46/79  brain]
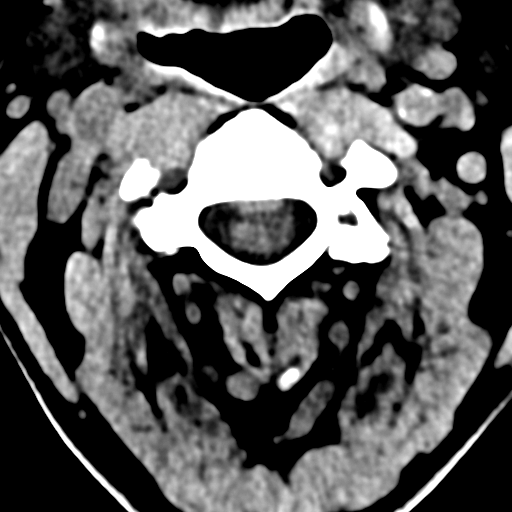
[im 53/79  brain]
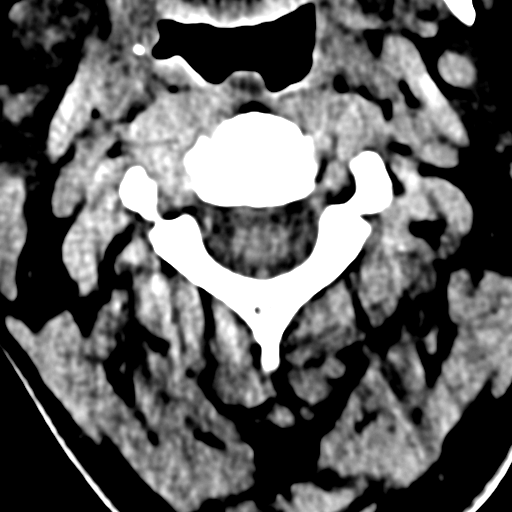
[im 59/79  brain]
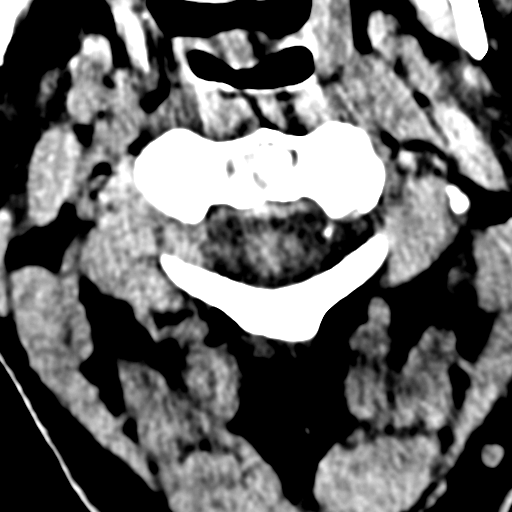
[im 72/79  brain]
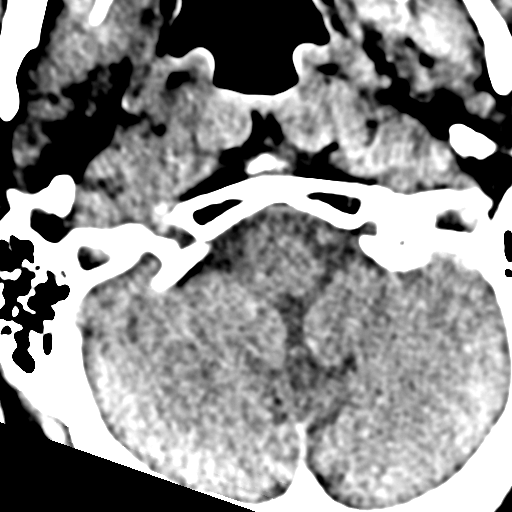

[18 of 47 positions shown; findings below may reference images not displayed]

FINDINGS: Brain: No evidence of acute infarction, hemorrhage, cerebral edema,
mass, mass effect, or midline shift. Ventricles and sulci are normal
for age. No extra-axial fluid collection. Periventricular white
matter changes, likely the sequela of chronic small vessel ischemic
disease.

Vascular: No hyperdense vessel or unexpected calcification.

Skull: Normal. Negative for fracture or focal lesion.

Sinuses/Orbits: No acute finding.

Other: The mastoid air cells are well aerated.

ASPECTS (Alberta Stroke Program Early CT Score)

- Ganglionic level infarction (caudate, lentiform nuclei, internal
capsule, insula, M1-M3 cortex): 7

- Supraganglionic infarction (M4-M6 cortex): 3

Total score (0-10 with 10 being normal): 10
IMPRESSION: 1. No acute intracranial process.
2. ASPECTS is 10

pm to provider [REDACTED] via secure text paging.
